# Patient Record
Sex: Female | Born: 1970 | Race: White | Hispanic: No | Marital: Married | Smoking: Never smoker
Health system: Southern US, Community
[De-identification: ages and names within clinical notes are randomized; demographics above are authoritative.]

## PROBLEM LIST (undated history)

## (undated) DIAGNOSIS — J329 Chronic sinusitis, unspecified: Secondary | ICD-10-CM

## (undated) DIAGNOSIS — E039 Hypothyroidism, unspecified: Secondary | ICD-10-CM

## (undated) DIAGNOSIS — J45909 Unspecified asthma, uncomplicated: Secondary | ICD-10-CM

## (undated) DIAGNOSIS — N63 Unspecified lump in unspecified breast: Secondary | ICD-10-CM

## (undated) DIAGNOSIS — F419 Anxiety disorder, unspecified: Secondary | ICD-10-CM

## (undated) HISTORY — DX: Unspecified asthma, uncomplicated: J45.909

## (undated) HISTORY — DX: Chronic sinusitis, unspecified: J32.9

## (undated) HISTORY — PX: ABDOMINAL HYSTERECTOMY: SHX81

## (undated) HISTORY — DX: Hypothyroidism, unspecified: E03.9

## (undated) HISTORY — PX: SINUS EXPLORATION: SHX5214

## (undated) HISTORY — DX: Anxiety disorder, unspecified: F41.9

---

## 2005-09-17 ENCOUNTER — Encounter: Admission: RE | Admit: 2005-09-17 | Discharge: 2005-09-17 | Payer: Self-pay | Admitting: Otolaryngology

## 2005-12-13 ENCOUNTER — Encounter (INDEPENDENT_AMBULATORY_CARE_PROVIDER_SITE_OTHER): Payer: Self-pay | Admitting: Specialist

## 2005-12-13 ENCOUNTER — Ambulatory Visit (HOSPITAL_BASED_OUTPATIENT_CLINIC_OR_DEPARTMENT_OTHER): Admission: RE | Admit: 2005-12-13 | Discharge: 2005-12-13 | Payer: Self-pay | Admitting: Otolaryngology

## 2006-07-24 ENCOUNTER — Other Ambulatory Visit: Admission: RE | Admit: 2006-07-24 | Discharge: 2006-07-24 | Payer: Self-pay | Admitting: Family Medicine

## 2006-09-25 ENCOUNTER — Emergency Department (HOSPITAL_COMMUNITY): Admission: EM | Admit: 2006-09-25 | Discharge: 2006-09-25 | Payer: Self-pay | Admitting: Emergency Medicine

## 2007-05-12 ENCOUNTER — Ambulatory Visit (HOSPITAL_COMMUNITY): Admission: RE | Admit: 2007-05-12 | Discharge: 2007-05-12 | Payer: Self-pay | Admitting: Family Medicine

## 2007-08-09 ENCOUNTER — Emergency Department (HOSPITAL_COMMUNITY): Admission: EM | Admit: 2007-08-09 | Discharge: 2007-08-09 | Payer: Self-pay | Admitting: Family Medicine

## 2008-06-15 ENCOUNTER — Encounter: Admission: RE | Admit: 2008-06-15 | Discharge: 2008-06-15 | Payer: Self-pay | Admitting: *Deleted

## 2008-06-24 ENCOUNTER — Other Ambulatory Visit: Admission: RE | Admit: 2008-06-24 | Discharge: 2008-06-24 | Payer: Self-pay | Admitting: Family Medicine

## 2009-04-21 ENCOUNTER — Encounter: Admission: RE | Admit: 2009-04-21 | Discharge: 2009-04-21 | Payer: Self-pay | Admitting: Allergy

## 2009-06-21 ENCOUNTER — Emergency Department (HOSPITAL_COMMUNITY): Admission: EM | Admit: 2009-06-21 | Discharge: 2009-06-21 | Payer: Self-pay | Admitting: Family Medicine

## 2009-08-15 ENCOUNTER — Other Ambulatory Visit: Admission: RE | Admit: 2009-08-15 | Discharge: 2009-08-15 | Payer: Self-pay | Admitting: Obstetrics and Gynecology

## 2010-09-17 ENCOUNTER — Other Ambulatory Visit: Payer: Self-pay | Admitting: Obstetrics and Gynecology

## 2010-09-17 ENCOUNTER — Other Ambulatory Visit (HOSPITAL_COMMUNITY)
Admission: RE | Admit: 2010-09-17 | Discharge: 2010-09-17 | Disposition: A | Discharge status: Home / Self Care | Payer: 59 | Source: Ambulatory Visit | Attending: Obstetrics and Gynecology | Admitting: Obstetrics and Gynecology

## 2010-09-17 DIAGNOSIS — Z01419 Encounter for gynecological examination (general) (routine) without abnormal findings: Secondary | ICD-10-CM | POA: Insufficient documentation

## 2010-09-25 ENCOUNTER — Other Ambulatory Visit: Payer: Self-pay | Admitting: Obstetrics and Gynecology

## 2010-09-25 DIAGNOSIS — Z1231 Encounter for screening mammogram for malignant neoplasm of breast: Secondary | ICD-10-CM

## 2010-09-27 ENCOUNTER — Ambulatory Visit
Admission: RE | Admit: 2010-09-27 | Discharge: 2010-09-27 | Disposition: A | Discharge status: Home / Self Care | Payer: 59 | Source: Ambulatory Visit | Attending: Obstetrics and Gynecology | Admitting: Obstetrics and Gynecology

## 2010-09-27 DIAGNOSIS — Z1231 Encounter for screening mammogram for malignant neoplasm of breast: Secondary | ICD-10-CM

## 2010-10-29 ENCOUNTER — Encounter (HOSPITAL_COMMUNITY): Payer: 59

## 2010-10-29 ENCOUNTER — Other Ambulatory Visit: Payer: Self-pay | Admitting: Obstetrics and Gynecology

## 2010-10-29 LAB — SURGICAL PCR SCREEN: MRSA, PCR: NEGATIVE

## 2010-10-29 LAB — BASIC METABOLIC PANEL
BUN: 10 mg/dL (ref 6–23)
GFR calc Af Amer: >60 mL/min (ref >60)
Glucose, Bld: 83 mg/dL (ref 70–99)
Potassium: 3.4 mEq/L — ABNORMAL LOW (ref 3.5–5.1)
Sodium: 131 mEq/L — ABNORMAL LOW (ref 135–145)

## 2010-10-29 LAB — CBC
MCH: 31.5 pg (ref 26.0–34.0)
MCHC: 33.2 g/dL (ref 30.0–36.0)
Platelets: 233 10*3/uL (ref 150–400)
RDW: 12.1 % (ref 11.5–15.5)

## 2010-11-02 NOTE — Op Note (Signed)
NAMEPAT, ELICKER NO.:  [id number]   MEDICAL RECORD NO.:  [id number]          PATIENT TYPE:  AMB   LOCATION:  DSC                          FACILITY:  [facility]   PHYSICIAN:  Kinnie Scales. Annalee Genta, M.D.DATE OF BIRTH:  06-Jun-1971   DATE OF PROCEDURE:  12/13/2005  DATE OF DISCHARGE:                                 OPERATIVE REPORT   PREOPERATIVE DIAGNOSES:  1.  Chronic sinusitis.  2.  Nasal polyposis.  3.  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  4.  Inferior turbinate hypertrophy.   POSTOPERATIVE DIAGNOSES:  1.  Chronic sinusitis.  2.  Nasal polyposis.  3.  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  4.  Inferior turbinate hypertrophy.   INDICATIONS FOR PROCEDURE:  1.  Chronic sinusitis.  2.  Nasal polyposis.  3.  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  4.  Inferior turbinate hypertrophy.   SURGICAL PROCEDURES:  1.  Bilateral endoscopic sinus surgery with InstaTrak guidance consisting of      bilateral total ethmoidectomy, bilateral maxillary antrostomy with      removal of diseased tissue, and nasofrontal recess exploration.  2.  Revision nasal septoplasty.  3.  Bilateral inferior turbinate reduction.   SURGEON:  Kinnie Scales. Annalee Genta, M.D.   ANESTHESIA:  General.   COMPLICATIONS:  None.   BLOOD LOSS:  Approximately 100 mL.   Tissue transferred to pathology for gross and microscopic evaluation.  The  patient will follow my office in 1 week for postoperative care.   BRIEF HISTORY:  The patient is a 40 year old white female who was referred  for evaluation of chronic sinus complaints.  She had undergone prior  septoplasty and limited endoscopic sinus surgery in 2002 and was referred to  our office for additional evaluation and treatment.  The patient complained  of chronic nasal congestion, heavy postnasal discharge, and nasal airway  obstruction.  Examination in the office revealed chronic nasal polyposis,  and CT  scanning showed diffuse mucosal disease involving the ethmoid,  maxillary and frontal sinuses.  The patient was treated with aggressive  medical therapy including topical oral steroids, antibiotics, mucus thinners  and allergy therapy and despite this treatment continued have ongoing  symptoms.  Given the chronic nature of her symptoms and history, I  recommended that we consider bilateral endoscopic sinus surgery, revision  septoplasty and turbinate reduction.  Prior to surgery a complete CT scan  with InstaTrak format was obtained and used for the surgical procedure.  The  risks, benefits and possible complications of the surgical procedures were  discussed in detail with the patient, who understood and concurred with our  plan for surgery, which is scheduled for December 13, 2005.   SURGICAL PROCEDURE:  The patient was brought to the operating room on December 13, 2005, placed in supine position on the operating table.  General  endotracheal anesthesia was established without difficulty.  The patient was  adequately anesthetized.  Her nose was injected with a total of 8 mL of 1%  lidocaine with 1:100,000 solution of epinephrine injecting in a submucosal  fashion along  the nasal septum, lateral nasal wall, middle turbinates, and  within the nasal polypoid material.  The patient's nose was then packed with  Afrin-soaked cottonoid pledgets and left in place for approximately 10  minutes to allow for vasoconstriction and hemostasis.  She was positioned on  the operating table and prepped and draped in a sterile fashion.  The  InstaTrak headgear was applied and anatomic and surgical landmarks were  identified and confirmed.   The procedure was begun on the patient's right-hand side.  Packing was  removed and using the InstaTrak, the nasal cavity was examined.  There was  significant polypoid disease within the middle meatus.  This was resected  using a 0 degree telescope and a straight  microdebrider.  The uncinate  process was reflected medially and resected with a through-cutting forceps.  Dissection was then carried out to the ethmoid bulla from anterior to  posterior along the floor of the ethmoid sinus, removing diseased mucosa and  polyps.  The posterior aspect of the ethmoid sinus was identified.  The roof  of the ethmoid was confirmed using the InstaTrak.  Using a 45 degree  telescope along the roof of the ethmoid and a curved microdebrider,  dissection was carried from posterior to anterior, again confirming location  with the InstaTrak throughout this portion of the procedure.  The  nasofrontal recess was identified.  It was completely occluded with  underlying ethmoid air cells and thick polypoid material.  This was removed  using a microdebrider under direct visualization, and the nasofrontal recess  was widely patent at the conclusion of the surgical procedure.  There was  thick mucoid material aspirated from the right frontal sinus.  Attention was  then turned to the lateral nasal wall, where residual uncinate process was  resected and the natural ostia maxillary sinus was enlarged to create a  widely patent ostium.  There was thick mucoid material aspirated from within  the right maxillary sinus.   Endoscopic sinus surgery was then undertaken on the left-hand side, where a  similar procedure was carried out.  There was dense scar tissue between the  middle turbinate and lateral nasal wall, which was divided with the through  cutting forceps.  With the middle turbinate medialized, dissection was then  carried out removing the uncinate process and resecting from anterior to  posterior along floor of the ethmoid.  The ethmoid sinus was completely  opacified with polypoid disease, which was resected.  The posterior superior  of the ethmoid was identified and dissection was then carried out using a 45 degree telescope from posterior to anterior and a curved  microdebrider with  InstaTrak guidance.  Again the nasofrontal recess was occluded.  This was  opened using a microdebrider under InstaTrak guidance and a widely patent  nasofrontal recess was created.  Attention was then turned to the lateral  nasal wall, where the natural ostium of the maxillary sinus was identified.  This was occluded with polypoid disease.  The ostium was enlarged and thick  mucoid material was aspirated from the left maxillary sinus.  This was sent  to pathology for evaluation for allergic and fungal mucin.  The sinus  cavities were then thoroughly irrigated and cleared of surgical debris.   Revision septoplasty was performed.  The patient had a large superior septal  spur with residual bone and cartilage in the superior aspect of the septum.  A vertically-oriented incision was created in the midaspect of the septum  with a #  15 scalpel and using both direct and endoscopic visualization,  mucosal flaps were elevated in order to expose the underlying bone and  cartilage, which was resected with a through-cutting forceps and the [place] Monday and a cutting Jansen-Middleton forceps.  The mucosa was reapproximated  with a 4-0 gut suture on a Keith needle and the septum was in midline  position.  Bilateral Doyle nasal septal splints were placed after the  application of Bactroban ointment and were sutured position with a 3-0  Ethilon suture.   Bilateral inferior turbinate reduction performed was with the bipolar  cautery set at 12 watts.  Two submucosal passes were made in each inferior  turbinate.  When the turbinates were adequately cauterized, they were  outfractured to create a more patent nasal cavity.   The patient's nasal cavity and nasopharynx were then irrigated and  suctioned.  A 50/50 mix of Kenalog 40 mg and Bactroban was then instilled  within the frontal, ethmoid and maxillary sinuses bilaterally, and bilateral  Kennedy sinus packs were placed in the common  ethmoid cavity on each side  and hydrated with saline.  The oral cavity was suctioned and an orogastric  tube was passed.  Stomach contents were aspirated.  The patient was then  awakened from her anesthetic.  She was extubated and transferred from the  operating room to the recovery room in stable condition.  No complications.  Blood loss approximately 100 mL.           ______________________________  Kinnie Scales. Annalee Genta, M.D.     DLS/MEDQ  D:  21/30/8657  T:  12/13/2005  Job:  [id number]

## 2010-11-06 ENCOUNTER — Other Ambulatory Visit: Payer: Self-pay | Admitting: Obstetrics and Gynecology

## 2010-11-06 ENCOUNTER — Ambulatory Visit (HOSPITAL_COMMUNITY)
Admission: RE | Admit: 2010-11-06 | Discharge: 2010-11-07 | Disposition: A | Discharge status: Home / Self Care | Payer: 59 | Source: Ambulatory Visit | Attending: Obstetrics and Gynecology | Admitting: Obstetrics and Gynecology

## 2010-11-06 DIAGNOSIS — D251 Intramural leiomyoma of uterus: Secondary | ICD-10-CM | POA: Insufficient documentation

## 2010-11-06 DIAGNOSIS — N8000 Endometriosis of the uterus, unspecified: Secondary | ICD-10-CM | POA: Insufficient documentation

## 2010-11-06 DIAGNOSIS — N812 Incomplete uterovaginal prolapse: Secondary | ICD-10-CM | POA: Insufficient documentation

## 2010-11-06 LAB — TYPE AND SCREEN
ABO/RH(D): O POS
Antibody Screen: NEGATIVE

## 2010-11-06 LAB — PREGNANCY, URINE: Preg Test, Ur: NEGATIVE

## 2010-11-06 LAB — ABO/RH: ABO/RH(D): O POS

## 2010-11-07 LAB — CBC
HCT: 37.4 % (ref 36.0–46.0)
MCHC: 32.9 g/dL (ref 30.0–36.0)
Platelets: 243 10*3/uL (ref 150–400)
RDW: 12 % (ref 11.5–15.5)

## 2010-12-10 NOTE — Op Note (Signed)
Sophia Perez, BAYLE NO.:  [id number]  MEDICAL RECORD NO.:  [id number]           PATIENT TYPE:  O  LOCATION:  9306                          FACILITY:  [facility]  PHYSICIAN:  Gerald Leitz, MD          DATE OF BIRTH:  April 05, 1971  DATE OF PROCEDURE:  11/06/2010 DATE OF DISCHARGE:                              OPERATIVE REPORT   PREOPERATIVE DIAGNOSES: 1. Uterine prolapse. 2. Cystocele. 3. Rectocele.  POSTOPERATIVE DIAGNOSIS: 1. Uterine prolapse. 2. Cystocele. 3. Rectocele.  PROCEDURE:  Vaginal hysterectomy and anterior repair.  SURGEON:  Gerald Leitz, MD  ASSISTANT:  Patsy Baltimore, MD  ANESTHESIA:  General.  FINDINGS:  Grade 3 uterine prolapse as well as moderate cystocele, very small rectocele.  SPECIMEN:  Uterus and cervix.  DISPOSITION OF SPECIMEN:  Pathology.  URINE OUTPUT:  500 mL.  ESTIMATED BLOOD LOSS:  100 mL.  FLUIDS:  1200 mL.  COMPLICATIONS:  None.  DESCRIPTION OF PROCEDURE:  The patient was taken to the operating room where she was placed under general anesthesia.  She was placed in a dorsal lithotomy position, prepped and draped in the usual sterile fashion.  A weighted speculum placed in the vaginal vault.  The anterior lip of the cervix was grasped with 2 Lahey clamps.  The cervix was injected circumferentially with 1% Xylocaine with 1:100,000 epinephrine. The cervix was then circumferentially incised with a scalpel.  The bladder was dissected off the lower uterine segment with Mayo scissors. The peritoneum was identified and entered sharply.  Attention was turned to the posterior cul-de-sac, which was incised with Mayo scissors and entered successfully. A long weighted speculum was then placed.  The uterosacral ligaments were clamped bilaterally, transected, suture ligated with 0 Vicryl.  The cardinal ligament was then clamped bilaterally, transected, and suture ligated with 0 Vicryl.  The utero- ovarian ligaments were clamped  bilaterally, transected, and the specimen was handed off.  The utero-ovarian ligaments were ligated with a free tie of 0 Vicryl followed by suture ligature of 0 Vicryl.  Ovaries appeared normal bilaterally.  The patient was noted to have some bleeding from the right sidewall.  Hemostasis was obtained with a figure- of-eight suture of 2-0 Vicryl.  Uterosacral ligaments were plicated with the 0 Vicryl.  Angle stitches were placed along the uterosacral ligaments using 0 Vicryl prior to plication.  The peritoneum was reapproximated with 2-0 chromic.  Attention was turned to the anterior vaginal mucosa which was injected with 1% Xylocaine with epinephrine. The vaginal mucosa was then incised with Metzenbaum scissors and the bladder was dissected off the vaginal mucosa.  The vesicopubic fascia was identified bilaterally and reapproximated with 2-0 Vicryl. Redundant vaginal mucosa was then excised with Mayo scissors.  The vaginal mucosa was reapproximated with 2-0 Vicryl in a running locked fashion.  The vaginal cuff was reapproximated with 0 Vicryl in a running locked fashion.  Sponge, lap, and needle counts were correct x2.  The patient was taken to recovery room awake and in stable condition.     Gerald Leitz, MD     TC/MEDQ  D:  11/06/2010  T:  11/07/2010  Job:  [id number]  Electronically Signed by Gerald Leitz MD on 12/10/2010 01:03:33 PM

## 2011-04-16 DIAGNOSIS — S0992XA Unspecified injury of nose, initial encounter: Secondary | ICD-10-CM | POA: Insufficient documentation

## 2011-04-16 DIAGNOSIS — J339 Nasal polyp, unspecified: Secondary | ICD-10-CM | POA: Insufficient documentation

## 2011-04-16 DIAGNOSIS — J329 Chronic sinusitis, unspecified: Secondary | ICD-10-CM | POA: Insufficient documentation

## 2011-10-02 ENCOUNTER — Other Ambulatory Visit (HOSPITAL_COMMUNITY)
Admission: RE | Admit: 2011-10-02 | Discharge: 2011-10-02 | Disposition: A | Discharge status: Home / Self Care | Payer: 59 | Source: Ambulatory Visit | Attending: Obstetrics and Gynecology | Admitting: Obstetrics and Gynecology

## 2011-10-02 ENCOUNTER — Other Ambulatory Visit: Payer: Self-pay | Admitting: Obstetrics and Gynecology

## 2011-10-02 DIAGNOSIS — Z01419 Encounter for gynecological examination (general) (routine) without abnormal findings: Secondary | ICD-10-CM | POA: Insufficient documentation

## 2011-12-25 DIAGNOSIS — E785 Hyperlipidemia, unspecified: Secondary | ICD-10-CM | POA: Insufficient documentation

## 2011-12-25 DIAGNOSIS — J45909 Unspecified asthma, uncomplicated: Secondary | ICD-10-CM | POA: Insufficient documentation

## 2011-12-25 DIAGNOSIS — N816 Rectocele: Secondary | ICD-10-CM | POA: Insufficient documentation

## 2012-05-08 ENCOUNTER — Other Ambulatory Visit: Payer: Self-pay

## 2012-05-08 DIAGNOSIS — R1011 Right upper quadrant pain: Secondary | ICD-10-CM

## 2012-05-12 ENCOUNTER — Ambulatory Visit
Admission: RE | Admit: 2012-05-12 | Discharge: 2012-05-12 | Disposition: A | Discharge status: Home / Self Care | Payer: 59 | Source: Ambulatory Visit

## 2012-05-12 DIAGNOSIS — R1011 Right upper quadrant pain: Secondary | ICD-10-CM

## 2013-03-11 ENCOUNTER — Other Ambulatory Visit: Payer: Self-pay | Admitting: Otolaryngology

## 2013-04-19 ENCOUNTER — Other Ambulatory Visit: Payer: Self-pay

## 2013-04-19 DIAGNOSIS — Z1231 Encounter for screening mammogram for malignant neoplasm of breast: Secondary | ICD-10-CM

## 2013-05-18 ENCOUNTER — Ambulatory Visit
Admission: RE | Admit: 2013-05-18 | Discharge: 2013-05-18 | Disposition: A | Discharge status: Home / Self Care | Payer: 59 | Source: Ambulatory Visit

## 2013-05-18 DIAGNOSIS — Z1231 Encounter for screening mammogram for malignant neoplasm of breast: Secondary | ICD-10-CM

## 2014-05-19 ENCOUNTER — Other Ambulatory Visit: Payer: Self-pay

## 2014-05-19 DIAGNOSIS — Z1231 Encounter for screening mammogram for malignant neoplasm of breast: Secondary | ICD-10-CM

## 2014-06-27 ENCOUNTER — Ambulatory Visit
Admission: RE | Admit: 2014-06-27 | Discharge: 2014-06-27 | Disposition: A | Discharge status: Home / Self Care | Payer: 59 | Source: Ambulatory Visit

## 2014-06-27 DIAGNOSIS — Z1231 Encounter for screening mammogram for malignant neoplasm of breast: Secondary | ICD-10-CM

## 2014-12-20 ENCOUNTER — Ambulatory Visit (INDEPENDENT_AMBULATORY_CARE_PROVIDER_SITE_OTHER): Payer: 59

## 2014-12-20 ENCOUNTER — Ambulatory Visit (INDEPENDENT_AMBULATORY_CARE_PROVIDER_SITE_OTHER): Payer: 59 | Admitting: Podiatry

## 2014-12-20 ENCOUNTER — Encounter: Payer: Self-pay | Admitting: Podiatry

## 2014-12-20 VITALS — BP 125/76 | HR 65 | Resp 16

## 2014-12-20 DIAGNOSIS — M2041 Other hammer toe(s) (acquired), right foot: Secondary | ICD-10-CM | POA: Diagnosis not present

## 2014-12-20 NOTE — Progress Notes (Signed)
   Subjective:    Patient ID: Sophia Perez, female    DOB: 1970/08/20, 44 y.o.   MRN: [id number]  HPI Comments: "I have these corns"  Patient c/o tender 3rd and 5th toes right since April 2016. She has callused areas. Tried corn pads and padded. Shoes are uncomfortable. Some redness.      Review of Systems  HENT: Positive for sinus pressure.   All other systems reviewed and are negative.      Objective:   Physical Exam: I have reviewed her past medical history medications allergies surgery social history and review of systems. Pulses are strongly palpable. Neurologic sensorium is intact versus once the monofilament. Deep tendon reflexes are intact bilateral and muscle strength +5 over 5 dorsiflexion plantar flexors and inverters everters all intrinsic musculature is intact. 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 Radiographs confirm these deformities with osteophytic changes of the third DIPJ. Cutaneous evaluation demonstrates supple well-hydrated cutis with the exception of mild erythema to the third DIPJ as well as velocities to the fifth digit right foot.        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  Plan: Discussed the etiology pathology conservative versus surgical therapies. At this point we signed a consent today consenting her for a DIPJ arthroplasty third digit right foot and a derotational arthroplasty fifth digit right foot. I answered all of the questions regarding these procedures to the best of my ability in layman's terms. She understood it was amenable to it and signed all 3 pages of the consent form. We did discuss the possible postop complications which may include but are not limited to stop pain bleeding swelling infection recurrence and need for further surgery also digit  also limb loss of life. She was dispensed a Darco shoe for her follow-up time.

## 2014-12-20 NOTE — Patient Instructions (Signed)
Pre-Operative Instructions  Congratulations, you have decided to take an important step to improving your quality of life.  You can be assured that the doctors of [facility] will be with you every step of the way.  1. Plan to be at the surgery center/hospital at least 1 (one) hour prior to your scheduled time unless otherwise directed by the surgical center/hospital staff.  You must have a responsible adult accompany you, remain during the surgery and drive you home.  Make sure you have directions to the surgical center/hospital and know how to get there on time. 2. For hospital based surgery you will need to obtain a history and physical form from your family physician within 1 month prior to the date of surgery- we will give you a form for you primary physician.  3. We make every effort to accommodate the date you request for surgery.  There are however, times where surgery dates or times have to be moved.  We will contact you as soon as possible if a change in schedule is required.   4. No Aspirin/Ibuprofen for one week before surgery.  If you are on aspirin, any non-steroidal anti-inflammatory medications (Mobic, Aleve, Ibuprofen) you should stop taking it 7 days prior to your surgery.  You make take Tylenol  For pain prior to surgery.  5. Medications- If you are taking daily heart and blood pressure medications, seizure, reflux, allergy, asthma, anxiety, pain or diabetes medications, make sure the surgery center/hospital is aware before the day of surgery so they may notify you which medications to take or avoid the day of surgery. 6. No food or drink after midnight the night before surgery unless directed otherwise by surgical center/hospital staff. 7. No alcoholic beverages 24 hours prior to surgery.  No smoking 24 hours prior to or 24 hours after surgery. 8. Wear loose pants or shorts- loose enough to fit over bandages, boots, and casts. 9. No slip on shoes, sneakers are best. 10. Bring  your boot with you to the surgery center/hospital.  Also bring crutches or a walker if your physician has prescribed it for you.  If you do not have this equipment, it will be provided for you after surgery. 11. If you have not been contracted by the surgery center/hospital by the day before your surgery, call to confirm the date and time of your surgery. 12. Leave-time from work may vary depending on the type of surgery you have.  Appropriate arrangements should be made prior to surgery with your employer. 13. Prescriptions will be provided immediately following surgery by your doctor.  Have these filled as soon as possible after surgery and take the medication as directed. 14. Remove nail polish on the operative foot. 15. Wash the night before surgery.  The night before surgery wash the foot and leg well with the antibacterial soap provided and water paying special attention to beneath the toenails and in between the toes.  Rinse thoroughly with water and dry well with a towel.  Perform this wash unless told not to do so by your physician.  Enclosed: 1 Ice pack (please put in freezer the night before surgery)   1 Hibiclens skin cleaner   Pre-op Instructions  If you have any questions regarding the instructions, do not hesitate to call our office.  [place]: [address] [place], [place] [address] [phone]  [place]: [address] [place], [place] [address] [phone]  [place]: [address]  [place], [place] [address] [phone]  Dr. Richard   Tuchman DPM, Dr. Norman Regal DPM Dr. Richard Sikora DPM, Dr. M. Todd Hyatt DPM, Dr. Kathryn Egerton DPM 

## 2015-06-23 DIAGNOSIS — J069 Acute upper respiratory infection, unspecified: Secondary | ICD-10-CM | POA: Diagnosis not present

## 2015-07-18 MED FILL — ALPRAZolam 0.25 MG TABS: 0.25 | 10 days supply | Qty: 10 | Fill #0

## 2015-08-25 MED FILL — SERTRALINE HCL 100 MG TAB: 100 | 30 days supply | Qty: 60 | Fill #4

## 2015-09-15 MED FILL — ALPRAZolam 0.25 MG TABS: 0.25 | 10 days supply | Qty: 10 | Fill #0

## 2015-10-09 DIAGNOSIS — J00 Acute nasopharyngitis [common cold]: Secondary | ICD-10-CM | POA: Diagnosis not present

## 2015-10-10 ENCOUNTER — Other Ambulatory Visit: Payer: Self-pay

## 2015-10-10 DIAGNOSIS — J069 Acute upper respiratory infection, unspecified: Secondary | ICD-10-CM | POA: Diagnosis not present

## 2015-10-10 DIAGNOSIS — R05 Cough: Secondary | ICD-10-CM | POA: Diagnosis not present

## 2015-10-10 DIAGNOSIS — Z1231 Encounter for screening mammogram for malignant neoplasm of breast: Secondary | ICD-10-CM

## 2015-10-15 DIAGNOSIS — R05 Cough: Secondary | ICD-10-CM | POA: Diagnosis not present

## 2015-10-24 ENCOUNTER — Ambulatory Visit
Admission: RE | Admit: 2015-10-24 | Discharge: 2015-10-24 | Disposition: A | Discharge status: Home / Self Care | Payer: 59 | Source: Ambulatory Visit

## 2015-10-24 DIAGNOSIS — Z1231 Encounter for screening mammogram for malignant neoplasm of breast: Secondary | ICD-10-CM

## 2015-10-26 ENCOUNTER — Other Ambulatory Visit: Payer: Self-pay | Admitting: Family Medicine

## 2015-10-26 DIAGNOSIS — R928 Other abnormal and inconclusive findings on diagnostic imaging of breast: Secondary | ICD-10-CM

## 2015-11-03 ENCOUNTER — Ambulatory Visit
Admission: RE | Admit: 2015-11-03 | Discharge: 2015-11-03 | Disposition: A | Discharge status: Home / Self Care | Payer: 59 | Source: Ambulatory Visit | Attending: Family Medicine | Admitting: Family Medicine

## 2015-11-03 DIAGNOSIS — R928 Other abnormal and inconclusive findings on diagnostic imaging of breast: Secondary | ICD-10-CM

## 2015-11-03 DIAGNOSIS — N63 Unspecified lump in breast: Secondary | ICD-10-CM | POA: Diagnosis not present

## 2015-11-20 DIAGNOSIS — Z0000 Encounter for general adult medical examination without abnormal findings: Secondary | ICD-10-CM | POA: Diagnosis not present

## 2015-11-20 DIAGNOSIS — Z13220 Encounter for screening for lipoid disorders: Secondary | ICD-10-CM | POA: Diagnosis not present

## 2015-11-20 DIAGNOSIS — E039 Hypothyroidism, unspecified: Secondary | ICD-10-CM | POA: Diagnosis not present

## 2015-11-20 DIAGNOSIS — Z131 Encounter for screening for diabetes mellitus: Secondary | ICD-10-CM | POA: Diagnosis not present

## 2015-11-27 MED FILL — ALPRAZolam 0.25 MG TABS: 0.25 | 10 days supply | Qty: 10 | Fill #0

## 2015-11-28 MED FILL — SERTRALINE HCL 100 MG TAB: 100 | 30 days supply | Qty: 60 | Fill #5

## 2015-12-05 MED FILL — LEVOTHYROXINE 50 MCG TABLET: 50 | 30 days supply | Qty: 30 | Fill #0

## 2016-01-09 MED FILL — LEVOTHYROXINE 50 MCG TABLET: 50 | 30 days supply | Qty: 30 | Fill #1

## 2016-01-10 MED FILL — SERTRALINE HCL 100 MG TAB: 100 | 30 days supply | Qty: 60 | Fill #0

## 2016-01-23 DIAGNOSIS — E039 Hypothyroidism, unspecified: Secondary | ICD-10-CM | POA: Diagnosis not present

## 2016-01-23 DIAGNOSIS — J302 Other seasonal allergic rhinitis: Secondary | ICD-10-CM | POA: Diagnosis not present

## 2016-01-23 DIAGNOSIS — J310 Chronic rhinitis: Secondary | ICD-10-CM | POA: Insufficient documentation

## 2016-02-15 MED FILL — ALPRAZolam 0.25 MG TABS: 0.25 | 10 days supply | Qty: 10 | Fill #0

## 2016-02-22 MED FILL — LEVOTHYROXINE 50 MCG TABLET: 50 | 30 days supply | Qty: 30 | Fill #0

## 2016-02-22 MED FILL — SERTRALINE HCL 100 MG TAB: 100 | 30 days supply | Qty: 60 | Fill #1

## 2016-04-17 MED FILL — ALPRAZolam 0.25 MG TABS: 0.25 | 10 days supply | Qty: 10 | Fill #0

## 2016-04-26 MED FILL — LEVOTHYROXINE 50 MCG TABLET: 50 | 30 days supply | Qty: 30 | Fill #1

## 2016-04-26 MED FILL — SERTRALINE HCL 100 MG TAB: 100 | 30 days supply | Qty: 60 | Fill #2

## 2016-06-26 MED FILL — SERTRALINE HCL 100 MG TAB: 100 | 30 days supply | Qty: 60 | Fill #3

## 2016-06-27 MED FILL — ALPRAZolam 0.25 MG TABS: 0.25 | 10 days supply | Qty: 10 | Fill #0

## 2016-07-16 DIAGNOSIS — F419 Anxiety disorder, unspecified: Secondary | ICD-10-CM | POA: Diagnosis not present

## 2016-07-16 DIAGNOSIS — I7300 Raynaud's syndrome without gangrene: Secondary | ICD-10-CM | POA: Diagnosis not present

## 2016-07-16 DIAGNOSIS — E039 Hypothyroidism, unspecified: Secondary | ICD-10-CM | POA: Diagnosis not present

## 2016-07-25 DIAGNOSIS — N63 Unspecified lump in unspecified breast: Secondary | ICD-10-CM | POA: Diagnosis not present

## 2016-07-26 ENCOUNTER — Other Ambulatory Visit: Payer: Self-pay | Admitting: Family Medicine

## 2016-07-26 DIAGNOSIS — N6310 Unspecified lump in the right breast, unspecified quadrant: Secondary | ICD-10-CM

## 2016-07-30 ENCOUNTER — Ambulatory Visit
Admission: RE | Admit: 2016-07-30 | Discharge: 2016-07-30 | Disposition: A | Discharge status: Home / Self Care | Payer: 59 | Source: Ambulatory Visit | Attending: Family Medicine | Admitting: Family Medicine

## 2016-07-30 DIAGNOSIS — N63 Unspecified lump in unspecified breast: Secondary | ICD-10-CM

## 2016-07-30 DIAGNOSIS — N6310 Unspecified lump in the right breast, unspecified quadrant: Secondary | ICD-10-CM

## 2016-07-30 DIAGNOSIS — R928 Other abnormal and inconclusive findings on diagnostic imaging of breast: Secondary | ICD-10-CM | POA: Diagnosis not present

## 2016-07-30 DIAGNOSIS — N6001 Solitary cyst of right breast: Secondary | ICD-10-CM | POA: Diagnosis not present

## 2016-07-30 HISTORY — DX: Unspecified lump in unspecified breast: N63.0

## 2016-08-30 MED FILL — SERTRALINE HCL 100 MG TAB: 100 | 30 days supply | Qty: 60 | Fill #4

## 2016-09-02 MED FILL — ALPRAZolam 0.25 MG TABS: 0.25 | 10 days supply | Qty: 10 | Fill #0

## 2016-10-04 DIAGNOSIS — J320 Chronic maxillary sinusitis: Secondary | ICD-10-CM | POA: Diagnosis not present

## 2016-10-04 DIAGNOSIS — Z87891 Personal history of nicotine dependence: Secondary | ICD-10-CM | POA: Diagnosis not present

## 2016-10-04 DIAGNOSIS — Z8709 Personal history of other diseases of the respiratory system: Secondary | ICD-10-CM | POA: Diagnosis not present

## 2016-10-04 MED FILL — DOXYCYCLINE HYCLATE 100 MG: 100 | 14 days supply | Qty: 28 | Fill #0

## 2016-10-05 DIAGNOSIS — B9689 Other specified bacterial agents as the cause of diseases classified elsewhere: Secondary | ICD-10-CM | POA: Diagnosis not present

## 2016-10-05 DIAGNOSIS — J320 Chronic maxillary sinusitis: Secondary | ICD-10-CM | POA: Diagnosis not present

## 2016-10-10 MED FILL — SERTRALINE HCL 100 MG TAB: 100 | 30 days supply | Qty: 60 | Fill #0

## 2016-10-22 MED FILL — ALPRAZolam 0.25 MG TABS: 0.25 | 10 days supply | Qty: 10 | Fill #0

## 2016-11-26 MED FILL — SERTRALINE HCL 100 MG TAB: 100 | 30 days supply | Qty: 60 | Fill #1

## 2016-11-26 MED FILL — ALPRAZolam 0.25 MG TABS: 0.25 | 10 days supply | Qty: 10 | Fill #1

## 2016-12-26 DIAGNOSIS — Z0000 Encounter for general adult medical examination without abnormal findings: Secondary | ICD-10-CM | POA: Diagnosis not present

## 2016-12-26 DIAGNOSIS — E039 Hypothyroidism, unspecified: Secondary | ICD-10-CM | POA: Diagnosis not present

## 2016-12-26 DIAGNOSIS — M549 Dorsalgia, unspecified: Secondary | ICD-10-CM | POA: Diagnosis not present

## 2016-12-26 DIAGNOSIS — E785 Hyperlipidemia, unspecified: Secondary | ICD-10-CM | POA: Diagnosis not present

## 2016-12-26 DIAGNOSIS — F419 Anxiety disorder, unspecified: Secondary | ICD-10-CM | POA: Diagnosis not present

## 2016-12-26 DIAGNOSIS — Z1211 Encounter for screening for malignant neoplasm of colon: Secondary | ICD-10-CM | POA: Diagnosis not present

## 2016-12-26 MED FILL — SERTRALINE HCL 100 MG TAB: 100 | 30 days supply | Qty: 30 | Fill #0

## 2016-12-26 MED FILL — ALPRAZolam 0.25 MG TABS: 0.25 | 10 days supply | Qty: 10 | Fill #0

## 2017-01-27 MED FILL — SERTRALINE HCL 100 MG TAB: 100 | 30 days supply | Qty: 30 | Fill #1

## 2017-01-27 MED FILL — ALPRAZolam 0.25 MG TABS: 0.25 | 10 days supply | Qty: 10 | Fill #1

## 2017-02-18 DIAGNOSIS — Z1211 Encounter for screening for malignant neoplasm of colon: Secondary | ICD-10-CM | POA: Diagnosis not present

## 2017-03-10 MED FILL — SERTRALINE HCL 100 MG TAB: 100 | 30 days supply | Qty: 30 | Fill #2

## 2017-03-10 MED FILL — PEG-3350 SOLUTION: 420 | 1 days supply | Qty: 4000 | Fill #0

## 2017-03-19 DIAGNOSIS — K640 First degree hemorrhoids: Secondary | ICD-10-CM | POA: Diagnosis not present

## 2017-03-19 DIAGNOSIS — Z1211 Encounter for screening for malignant neoplasm of colon: Secondary | ICD-10-CM | POA: Diagnosis not present

## 2017-05-07 MED FILL — SERTRALINE HCL 100 MG TAB: 100 | 30 days supply | Qty: 30 | Fill #3

## 2017-05-07 MED FILL — ALPRAZolam 0.25 MG TABS: 0.25 | 10 days supply | Qty: 10 | Fill #0

## 2017-07-01 MED FILL — ALPRAZolam 0.25 MG TABS: 0.25 | 10 days supply | Qty: 10 | Fill #1

## 2017-07-01 MED FILL — SERTRALINE HCL 100 MG TAB: 100 | 30 days supply | Qty: 30 | Fill #4

## 2017-07-11 DIAGNOSIS — J329 Chronic sinusitis, unspecified: Secondary | ICD-10-CM | POA: Diagnosis not present

## 2017-07-11 DIAGNOSIS — M25512 Pain in left shoulder: Secondary | ICD-10-CM | POA: Diagnosis not present

## 2017-07-11 DIAGNOSIS — F419 Anxiety disorder, unspecified: Secondary | ICD-10-CM | POA: Diagnosis not present

## 2017-08-14 MED FILL — SERTRALINE HCL 100 MG TAB: 100 | 30 days supply | Qty: 30 | Fill #5

## 2017-08-17 DIAGNOSIS — J0100 Acute maxillary sinusitis, unspecified: Secondary | ICD-10-CM | POA: Diagnosis not present

## 2017-08-18 DIAGNOSIS — J040 Acute laryngitis: Secondary | ICD-10-CM | POA: Diagnosis not present

## 2017-10-23 DIAGNOSIS — H5201 Hypermetropia, right eye: Secondary | ICD-10-CM | POA: Diagnosis not present

## 2017-10-23 DIAGNOSIS — H524 Presbyopia: Secondary | ICD-10-CM | POA: Diagnosis not present

## 2017-10-27 MED FILL — SERTRALINE HCL 100 MG TAB: 100 | 30 days supply | Qty: 30 | Fill #0

## 2017-11-24 MED FILL — ALPRAZolam 0.25 MG TABS: 0.25 | 10 days supply | Qty: 10 | Fill #0

## 2017-11-24 MED FILL — SERTRALINE HCL 100 MG TAB: 100 | 30 days supply | Qty: 30 | Fill #1

## 2018-01-07 MED FILL — SERTRALINE HCL 100 MG TAB: 100 | 30 days supply | Qty: 30 | Fill #0

## 2018-01-12 DIAGNOSIS — E785 Hyperlipidemia, unspecified: Secondary | ICD-10-CM | POA: Diagnosis not present

## 2018-01-12 DIAGNOSIS — Z0000 Encounter for general adult medical examination without abnormal findings: Secondary | ICD-10-CM | POA: Diagnosis not present

## 2018-01-12 DIAGNOSIS — E039 Hypothyroidism, unspecified: Secondary | ICD-10-CM | POA: Diagnosis not present

## 2018-01-12 DIAGNOSIS — F419 Anxiety disorder, unspecified: Secondary | ICD-10-CM | POA: Diagnosis not present

## 2018-01-20 MED FILL — ALPRAZolam 0.25 MG TABS: 0.25 | 10 days supply | Qty: 10 | Fill #1

## 2018-01-21 MED FILL — LEVOTHYROXINE 50 MCG TABLET: 50 | 30 days supply | Qty: 30 | Fill #0

## 2018-01-30 DIAGNOSIS — M545 Low back pain: Secondary | ICD-10-CM | POA: Diagnosis not present

## 2018-02-02 MED FILL — METHOCARBAMOL 500 MG TABS: 500 | 30 days supply | Qty: 30 | Fill #0

## 2018-02-19 MED FILL — LEVOTHYROXINE 50 MCG TABLET: 50 | 30 days supply | Qty: 30 | Fill #1

## 2018-02-20 MED FILL — SERTRALINE HCL 100 MG TAB: 100 | 30 days supply | Qty: 30 | Fill #0

## 2018-03-04 DIAGNOSIS — E039 Hypothyroidism, unspecified: Secondary | ICD-10-CM | POA: Diagnosis not present

## 2018-03-05 DIAGNOSIS — M41125 Adolescent idiopathic scoliosis, thoracolumbar region: Secondary | ICD-10-CM | POA: Diagnosis not present

## 2018-03-05 DIAGNOSIS — M549 Dorsalgia, unspecified: Secondary | ICD-10-CM | POA: Diagnosis not present

## 2018-03-05 DIAGNOSIS — M546 Pain in thoracic spine: Secondary | ICD-10-CM | POA: Diagnosis not present

## 2018-03-05 DIAGNOSIS — M5416 Radiculopathy, lumbar region: Secondary | ICD-10-CM | POA: Diagnosis not present

## 2018-03-05 MED FILL — DICLOFENAC SODIUM 75 MG TAB: 75 | 30 days supply | Qty: 60 | Fill #0

## 2018-03-20 MED FILL — SERTRALINE HCL 100 MG TAB: 100 | 30 days supply | Qty: 30 | Fill #1

## 2018-03-23 MED FILL — ALPRAZolam 0.25 MG TABS: 0.25 | 10 days supply | Qty: 10 | Fill #0

## 2018-03-23 MED FILL — LEVOTHYROXINE 50 MCG TABLET: 50 | 30 days supply | Qty: 30 | Fill #0

## 2018-03-27 DIAGNOSIS — M5416 Radiculopathy, lumbar region: Secondary | ICD-10-CM | POA: Diagnosis not present

## 2018-03-27 DIAGNOSIS — M41125 Adolescent idiopathic scoliosis, thoracolumbar region: Secondary | ICD-10-CM | POA: Diagnosis not present

## 2018-05-06 MED FILL — SERTRALINE HCL 100 MG TAB: 100 | 30 days supply | Qty: 30 | Fill #2

## 2018-05-06 MED FILL — ALPRAZolam 0.25 MG TABS: 0.25 | 10 days supply | Qty: 10 | Fill #1

## 2018-05-06 MED FILL — LEVOTHYROXINE 50 MCG TABLET: 50 | 30 days supply | Qty: 30 | Fill #1

## 2018-05-20 ENCOUNTER — Other Ambulatory Visit: Payer: Self-pay | Admitting: Family Medicine

## 2018-05-20 DIAGNOSIS — Z1231 Encounter for screening mammogram for malignant neoplasm of breast: Secondary | ICD-10-CM

## 2018-06-24 MED FILL — SERTRALINE HCL 100 MG TAB: 100 | 30 days supply | Qty: 30 | Fill #3

## 2018-06-24 MED FILL — ALPRAZolam 0.25 MG TABS: 0.25 | 10 days supply | Qty: 10 | Fill #2

## 2018-06-24 MED FILL — LEVOTHYROXINE 50 MCG TABLET: 50 | 30 days supply | Qty: 30 | Fill #2

## 2018-07-02 ENCOUNTER — Ambulatory Visit
Admission: RE | Admit: 2018-07-02 | Discharge: 2018-07-02 | Disposition: A | Discharge status: Home / Self Care | Payer: 59 | Source: Ambulatory Visit | Attending: Family Medicine | Admitting: Family Medicine

## 2018-07-02 DIAGNOSIS — Z1231 Encounter for screening mammogram for malignant neoplasm of breast: Secondary | ICD-10-CM

## 2018-07-13 DIAGNOSIS — E039 Hypothyroidism, unspecified: Secondary | ICD-10-CM | POA: Diagnosis not present

## 2018-07-13 DIAGNOSIS — F419 Anxiety disorder, unspecified: Secondary | ICD-10-CM | POA: Diagnosis not present

## 2018-08-14 MED FILL — SERTRALINE HCL 100 MG TAB: 100 | 30 days supply | Qty: 30 | Fill #4

## 2018-08-14 MED FILL — LEVOTHYROXINE 50 MCG TABLET: 50 | 30 days supply | Qty: 30 | Fill #3

## 2018-09-07 MED FILL — SERTRALINE HCL 100 MG TAB: 100 | 30 days supply | Qty: 30 | Fill #0 | Status: TO

## 2018-09-07 MED FILL — LEVOTHYROXINE 50 MCG TABLET: 50 | 30 days supply | Qty: 30 | Fill #4 | Status: TO

## 2018-10-10 MED FILL — SERTRALINE HCL 100 MG TAB: 100 | 30 days supply | Qty: 30 | Fill #0

## 2018-10-10 MED FILL — LEVOTHYROXINE 50 MCG TABLET: 50 | 30 days supply | Qty: 30 | Fill #0

## 2018-11-26 MED FILL — LEVOTHYROXINE 50 MCG TABLET: 50 | 30 days supply | Qty: 30 | Fill #0

## 2018-11-26 MED FILL — SERTRALINE HCL 100 MG TAB: 100 | 30 days supply | Qty: 30 | Fill #0

## 2018-12-17 ENCOUNTER — Other Ambulatory Visit: Payer: Self-pay | Admitting: Family Medicine

## 2018-12-17 DIAGNOSIS — N6310 Unspecified lump in the right breast, unspecified quadrant: Secondary | ICD-10-CM

## 2018-12-24 ENCOUNTER — Ambulatory Visit
Admission: RE | Admit: 2018-12-24 | Discharge: 2018-12-24 | Disposition: A | Discharge status: Home / Self Care | Payer: 59 | Source: Ambulatory Visit | Attending: Family Medicine | Admitting: Family Medicine

## 2018-12-24 ENCOUNTER — Other Ambulatory Visit: Payer: Self-pay

## 2018-12-24 DIAGNOSIS — N6310 Unspecified lump in the right breast, unspecified quadrant: Secondary | ICD-10-CM

## 2018-12-24 DIAGNOSIS — R922 Inconclusive mammogram: Secondary | ICD-10-CM | POA: Diagnosis not present

## 2018-12-24 DIAGNOSIS — N6001 Solitary cyst of right breast: Secondary | ICD-10-CM | POA: Diagnosis not present

## 2019-01-12 MED FILL — LEVOTHYROXINE 50 MCG TABLET: 50 | 30 days supply | Qty: 30 | Fill #1

## 2019-01-12 MED FILL — SERTRALINE HCL 100 MG TAB: 100 | 30 days supply | Qty: 30 | Fill #1

## 2019-01-12 MED FILL — DICLOFENAC SODIUM 75 MG TAB: 75 | 30 days supply | Qty: 60 | Fill #1

## 2019-02-01 DIAGNOSIS — Z0000 Encounter for general adult medical examination without abnormal findings: Secondary | ICD-10-CM | POA: Diagnosis not present

## 2019-02-01 DIAGNOSIS — E785 Hyperlipidemia, unspecified: Secondary | ICD-10-CM | POA: Diagnosis not present

## 2019-02-01 DIAGNOSIS — F419 Anxiety disorder, unspecified: Secondary | ICD-10-CM | POA: Diagnosis not present

## 2019-02-01 DIAGNOSIS — E039 Hypothyroidism, unspecified: Secondary | ICD-10-CM | POA: Diagnosis not present

## 2019-03-15 MED FILL — ALPRAZolam 0.25 MG TABS: 0.25 | 10 days supply | Qty: 10 | Fill #0

## 2019-03-23 MED FILL — SERTRALINE HCL 100 MG TAB: 100 | 30 days supply | Qty: 30 | Fill #2

## 2019-03-30 DIAGNOSIS — M5416 Radiculopathy, lumbar region: Secondary | ICD-10-CM | POA: Diagnosis not present

## 2019-03-30 DIAGNOSIS — M41125 Adolescent idiopathic scoliosis, thoracolumbar region: Secondary | ICD-10-CM | POA: Diagnosis not present

## 2019-04-28 DIAGNOSIS — L989 Disorder of the skin and subcutaneous tissue, unspecified: Secondary | ICD-10-CM | POA: Diagnosis not present

## 2019-05-04 MED FILL — SERTRALINE HCL 100 MG TAB: 100 | 30 days supply | Qty: 30 | Fill #3

## 2019-05-04 MED FILL — ALPRAZolam 0.25 MG TABS: 0.25 | 10 days supply | Qty: 10 | Fill #1

## 2019-05-04 MED FILL — LEVOTHYROXINE 50 MCG TABLET: 50 | 30 days supply | Qty: 30 | Fill #1

## 2019-05-14 DIAGNOSIS — Z20828 Contact with and (suspected) exposure to other viral communicable diseases: Secondary | ICD-10-CM | POA: Diagnosis not present

## 2019-05-14 DIAGNOSIS — R519 Headache, unspecified: Secondary | ICD-10-CM | POA: Diagnosis not present

## 2019-06-15 MED FILL — SERTRALINE HCL 100 MG TAB: 100 | 30 days supply | Qty: 30 | Fill #4

## 2019-06-15 MED FILL — LEVOTHYROXINE 50 MCG TABLET: 50 | 30 days supply | Qty: 30 | Fill #2

## 2019-07-30 MED FILL — SERTRALINE HCL 100 MG TAB: 100 | 90 days supply | Qty: 90 | Fill #0

## 2019-07-30 MED FILL — ALPRAZolam 0.25 MG TABS: 0.25 | 10 days supply | Qty: 10 | Fill #2

## 2019-08-03 DIAGNOSIS — D2271 Melanocytic nevi of right lower limb, including hip: Secondary | ICD-10-CM | POA: Diagnosis not present

## 2019-08-03 DIAGNOSIS — L821 Other seborrheic keratosis: Secondary | ICD-10-CM | POA: Diagnosis not present

## 2019-08-03 DIAGNOSIS — D2272 Melanocytic nevi of left lower limb, including hip: Secondary | ICD-10-CM | POA: Diagnosis not present

## 2019-08-03 DIAGNOSIS — I788 Other diseases of capillaries: Secondary | ICD-10-CM | POA: Diagnosis not present

## 2019-08-03 DIAGNOSIS — D225 Melanocytic nevi of trunk: Secondary | ICD-10-CM | POA: Diagnosis not present

## 2019-08-03 DIAGNOSIS — L812 Freckles: Secondary | ICD-10-CM | POA: Diagnosis not present

## 2019-08-03 DIAGNOSIS — D2261 Melanocytic nevi of right upper limb, including shoulder: Secondary | ICD-10-CM | POA: Diagnosis not present

## 2019-08-03 DIAGNOSIS — D2262 Melanocytic nevi of left upper limb, including shoulder: Secondary | ICD-10-CM | POA: Diagnosis not present

## 2019-08-30 MED FILL — LEVOTHYROXINE 50 MCG TABLET: 50 | 30 days supply | Qty: 30 | Fill #3

## 2019-09-01 DIAGNOSIS — F419 Anxiety disorder, unspecified: Secondary | ICD-10-CM | POA: Diagnosis not present

## 2019-09-01 DIAGNOSIS — E039 Hypothyroidism, unspecified: Secondary | ICD-10-CM | POA: Diagnosis not present

## 2019-10-13 MED FILL — LEVOTHYROXINE 50 MCG TABLET: 50 | 30 days supply | Qty: 30 | Fill #4

## 2019-10-18 MED FILL — ALPRAZolam 0.25 MG TABS: 0.25 | 10 days supply | Qty: 10 | Fill #0

## 2019-10-28 DIAGNOSIS — F331 Major depressive disorder, recurrent, moderate: Secondary | ICD-10-CM | POA: Diagnosis not present

## 2019-11-09 MED FILL — SERTRALINE HCL 100 MG TAB: 100 | 90 days supply | Qty: 90 | Fill #1

## 2019-11-11 DIAGNOSIS — F331 Major depressive disorder, recurrent, moderate: Secondary | ICD-10-CM | POA: Diagnosis not present

## 2019-12-03 MED FILL — LEVOTHYROXINE 50 MCG TABLET: 50 | 30 days supply | Qty: 30 | Fill #5

## 2019-12-21 DIAGNOSIS — F331 Major depressive disorder, recurrent, moderate: Secondary | ICD-10-CM | POA: Diagnosis not present

## 2020-01-17 DIAGNOSIS — F331 Major depressive disorder, recurrent, moderate: Secondary | ICD-10-CM | POA: Diagnosis not present

## 2020-01-18 DIAGNOSIS — H539 Unspecified visual disturbance: Secondary | ICD-10-CM | POA: Diagnosis not present

## 2020-01-31 DIAGNOSIS — L814 Other melanin hyperpigmentation: Secondary | ICD-10-CM | POA: Diagnosis not present

## 2020-01-31 DIAGNOSIS — D225 Melanocytic nevi of trunk: Secondary | ICD-10-CM | POA: Diagnosis not present

## 2020-01-31 DIAGNOSIS — L821 Other seborrheic keratosis: Secondary | ICD-10-CM | POA: Diagnosis not present

## 2020-01-31 DIAGNOSIS — L700 Acne vulgaris: Secondary | ICD-10-CM | POA: Diagnosis not present

## 2020-01-31 DIAGNOSIS — D2271 Melanocytic nevi of right lower limb, including hip: Secondary | ICD-10-CM | POA: Diagnosis not present

## 2020-02-03 MED FILL — LEVOTHYROXINE 50 MCG TABLET: 50 | 30 days supply | Qty: 30 | Fill #0

## 2020-02-15 ENCOUNTER — Other Ambulatory Visit (HOSPITAL_COMMUNITY): Payer: Self-pay | Admitting: Family Medicine

## 2020-02-15 MED FILL — SERTRALINE HCL 100 MG TAB: 100 | 90 days supply | Qty: 90 | Fill #0

## 2020-02-15 MED FILL — ALPRAZolam 0.25 MG TABS: 0.25 | 10 days supply | Qty: 10 | Fill #1

## 2020-02-17 DIAGNOSIS — F331 Major depressive disorder, recurrent, moderate: Secondary | ICD-10-CM | POA: Diagnosis not present

## 2020-02-29 DIAGNOSIS — H524 Presbyopia: Secondary | ICD-10-CM | POA: Diagnosis not present

## 2020-02-29 DIAGNOSIS — H5310 Unspecified subjective visual disturbances: Secondary | ICD-10-CM | POA: Diagnosis not present

## 2020-02-29 DIAGNOSIS — H5203 Hypermetropia, bilateral: Secondary | ICD-10-CM | POA: Diagnosis not present

## 2020-02-29 DIAGNOSIS — H0288B Meibomian gland dysfunction left eye, upper and lower eyelids: Secondary | ICD-10-CM | POA: Diagnosis not present

## 2020-02-29 DIAGNOSIS — G43109 Migraine with aura, not intractable, without status migrainosus: Secondary | ICD-10-CM | POA: Diagnosis not present

## 2020-02-29 DIAGNOSIS — H0288A Meibomian gland dysfunction right eye, upper and lower eyelids: Secondary | ICD-10-CM | POA: Diagnosis not present

## 2020-03-02 DIAGNOSIS — Z0000 Encounter for general adult medical examination without abnormal findings: Secondary | ICD-10-CM | POA: Diagnosis not present

## 2020-03-02 DIAGNOSIS — E039 Hypothyroidism, unspecified: Secondary | ICD-10-CM | POA: Diagnosis not present

## 2020-03-02 DIAGNOSIS — E785 Hyperlipidemia, unspecified: Secondary | ICD-10-CM | POA: Diagnosis not present

## 2020-03-02 DIAGNOSIS — F419 Anxiety disorder, unspecified: Secondary | ICD-10-CM | POA: Diagnosis not present

## 2020-03-02 DIAGNOSIS — Z23 Encounter for immunization: Secondary | ICD-10-CM | POA: Diagnosis not present

## 2020-03-02 MED FILL — LEVOTHYROXINE 50 MCG TABLET: 50 | 90 days supply | Qty: 90 | Fill #0

## 2020-03-13 DIAGNOSIS — F331 Major depressive disorder, recurrent, moderate: Secondary | ICD-10-CM | POA: Diagnosis not present

## 2020-03-22 ENCOUNTER — Other Ambulatory Visit (HOSPITAL_COMMUNITY): Payer: Self-pay | Admitting: Family Medicine

## 2020-03-22 DIAGNOSIS — E039 Hypothyroidism, unspecified: Secondary | ICD-10-CM | POA: Diagnosis not present

## 2020-03-22 DIAGNOSIS — R059 Cough, unspecified: Secondary | ICD-10-CM | POA: Diagnosis not present

## 2020-03-22 DIAGNOSIS — R062 Wheezing: Secondary | ICD-10-CM | POA: Diagnosis not present

## 2020-03-22 DIAGNOSIS — J988 Other specified respiratory disorders: Secondary | ICD-10-CM | POA: Diagnosis not present

## 2020-03-22 MED FILL — AZITHROMYCIN 250 MG TABLET: 250 | 5 days supply | Qty: 6 | Fill #0

## 2020-03-22 MED FILL — FLUCONAZOLE 150 MG TABS: 150 | 1 days supply | Qty: 1 | Fill #0

## 2020-03-22 MED FILL — LEVOTHYROXINE 50 MCG TABLET: 50 | 90 days supply | Qty: 90 | Fill #0

## 2020-03-30 ENCOUNTER — Other Ambulatory Visit (HOSPITAL_COMMUNITY): Payer: Self-pay | Admitting: Student

## 2020-03-30 DIAGNOSIS — M5416 Radiculopathy, lumbar region: Secondary | ICD-10-CM | POA: Diagnosis not present

## 2020-03-30 MED FILL — DICLOFENAC SOD EC 75 MG TAB: 75 | 30 days supply | Qty: 60 | Fill #0

## 2020-04-14 MED FILL — ALPRAZolam 0.25 MG TABS: 0.25 | 10 days supply | Qty: 10 | Fill #2

## 2020-04-20 DIAGNOSIS — M5416 Radiculopathy, lumbar region: Secondary | ICD-10-CM | POA: Diagnosis not present

## 2020-04-20 DIAGNOSIS — M47816 Spondylosis without myelopathy or radiculopathy, lumbar region: Secondary | ICD-10-CM | POA: Diagnosis not present

## 2020-05-01 DIAGNOSIS — F331 Major depressive disorder, recurrent, moderate: Secondary | ICD-10-CM | POA: Diagnosis not present

## 2020-05-18 ENCOUNTER — Other Ambulatory Visit (HOSPITAL_COMMUNITY): Payer: Self-pay | Admitting: Family Medicine

## 2020-05-18 DIAGNOSIS — J45909 Unspecified asthma, uncomplicated: Secondary | ICD-10-CM | POA: Diagnosis not present

## 2020-05-18 MED FILL — ALBUTEROL SULFATE HFA 108 (: 108 (90 BAS | 17 days supply | Qty: 9 | Fill #0

## 2020-05-19 ENCOUNTER — Other Ambulatory Visit (HOSPITAL_COMMUNITY): Payer: Self-pay | Admitting: Family Medicine

## 2020-05-19 MED FILL — ADVAIR 250/50 DISKUS: 250-50 | 30 days supply | Qty: 60 | Fill #0

## 2020-06-07 MED FILL — SERTRALINE HCL 100 MG TAB: 100 | 90 days supply | Qty: 90 | Fill #1

## 2020-06-16 MED FILL — ADVAIR 250/50 DISKUS: 250-50 | 30 days supply | Qty: 60 | Fill #1

## 2020-07-26 MED FILL — LEVOTHYROXINE 50 MCG TABLET: 50 | 90 days supply | Qty: 90 | Fill #1

## 2020-07-26 MED FILL — ADVAIR 250/50 DISKUS: 250-50 | 30 days supply | Qty: 60 | Fill #2

## 2020-08-17 ENCOUNTER — Other Ambulatory Visit (HOSPITAL_COMMUNITY): Payer: Self-pay | Admitting: Pharmacist

## 2020-08-17 MED FILL — CARESTART COVID-19 HOME TES: 4 days supply | Qty: 4 | Fill #0

## 2020-08-29 ENCOUNTER — Other Ambulatory Visit: Payer: Self-pay | Admitting: Family Medicine

## 2020-08-29 ENCOUNTER — Other Ambulatory Visit (HOSPITAL_COMMUNITY): Payer: Self-pay | Admitting: Family Medicine

## 2020-08-29 DIAGNOSIS — Z1231 Encounter for screening mammogram for malignant neoplasm of breast: Secondary | ICD-10-CM

## 2020-08-30 ENCOUNTER — Other Ambulatory Visit (HOSPITAL_COMMUNITY): Payer: Self-pay | Admitting: Family Medicine

## 2020-08-30 DIAGNOSIS — J45909 Unspecified asthma, uncomplicated: Secondary | ICD-10-CM | POA: Diagnosis not present

## 2020-08-30 DIAGNOSIS — E039 Hypothyroidism, unspecified: Secondary | ICD-10-CM | POA: Diagnosis not present

## 2020-08-30 DIAGNOSIS — F419 Anxiety disorder, unspecified: Secondary | ICD-10-CM | POA: Diagnosis not present

## 2020-08-31 ENCOUNTER — Inpatient Hospital Stay: Admission: RE | Admit: 2020-08-31 | Payer: 59 | Source: Ambulatory Visit

## 2020-10-25 ENCOUNTER — Other Ambulatory Visit (HOSPITAL_COMMUNITY): Payer: Self-pay

## 2020-10-26 ENCOUNTER — Other Ambulatory Visit (HOSPITAL_COMMUNITY): Payer: Self-pay

## 2020-10-26 MED ORDER — FLUTICASONE-SALMETEROL 250-50 MCG/ACT IN AEPB
1.0000 | INHALATION_SPRAY | Freq: Two times a day (BID) | RESPIRATORY_TRACT | 5 refills | Status: DC
  Filled 2020-10-26: qty 60, 30d supply, fill #0
  Filled 2020-12-08: qty 60, 30d supply, fill #1
  Filled 2021-01-17: qty 180, 90d supply, fill #2
  Filled 2021-06-22: qty 60, 30d supply, fill #3

## 2020-10-27 ENCOUNTER — Other Ambulatory Visit (HOSPITAL_COMMUNITY): Payer: Self-pay

## 2020-12-01 IMAGING — US ULTRASOUND RIGHT BREAST LIMITED
1 series · 5 of 5 positions shown · non-contrast
Comparison: Previous exam(s).

CLINICAL DATA: 48-year-old female with a palpable abnormality in
the upper-outer right breast. The patient has history of cysts in
this location.

EXAM:
DIGITAL DIAGNOSTIC UNILATERAL RIGHT MAMMOGRAM WITH CAD AND TOMO
RIGHT BREAST ULTRASOUND

[Series 1: ultrasound right breast limited · 0.07mm/px · 5 of 5 slices shown]
[im 1/5]
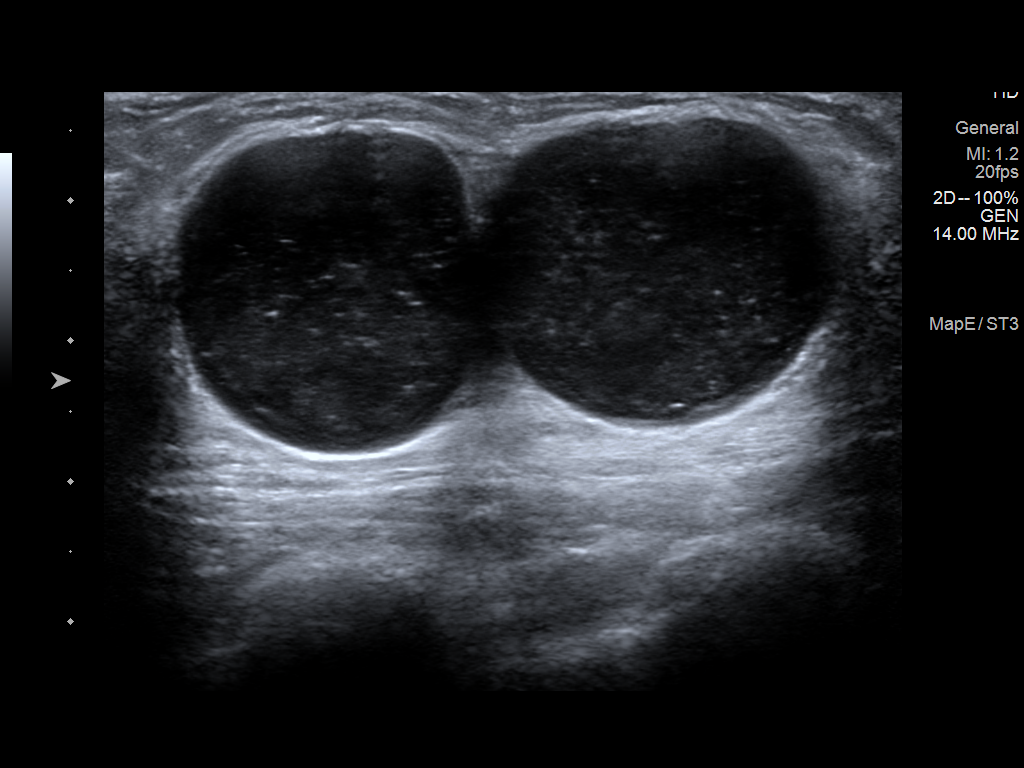
[im 2/5]
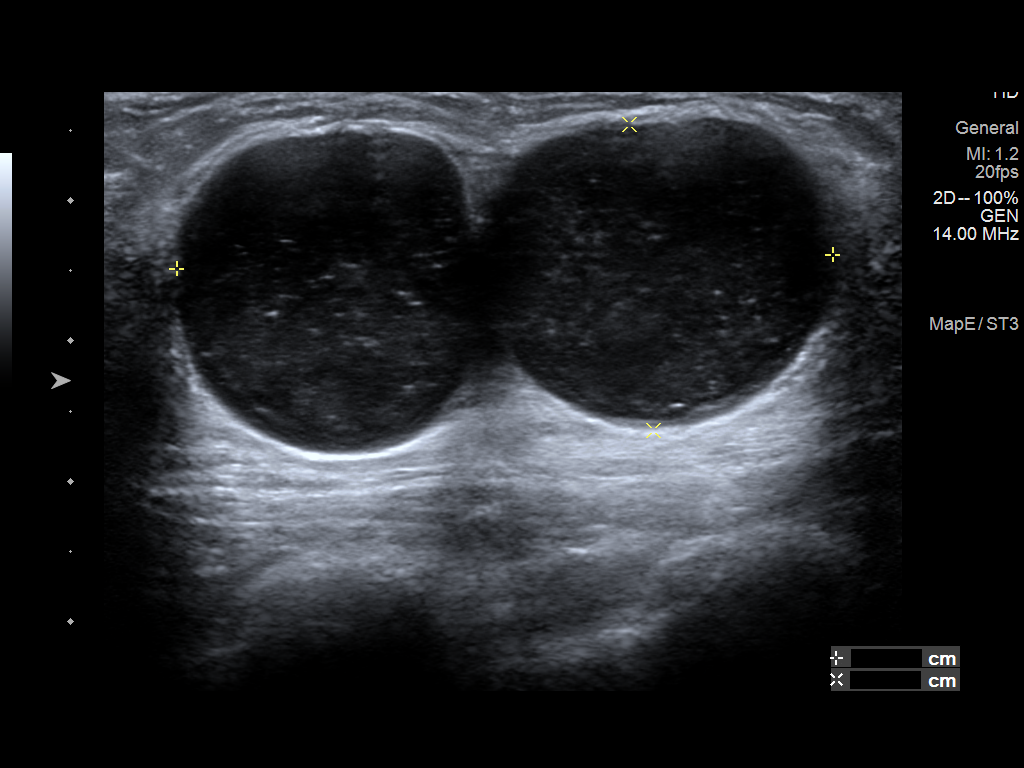
[im 3/5]
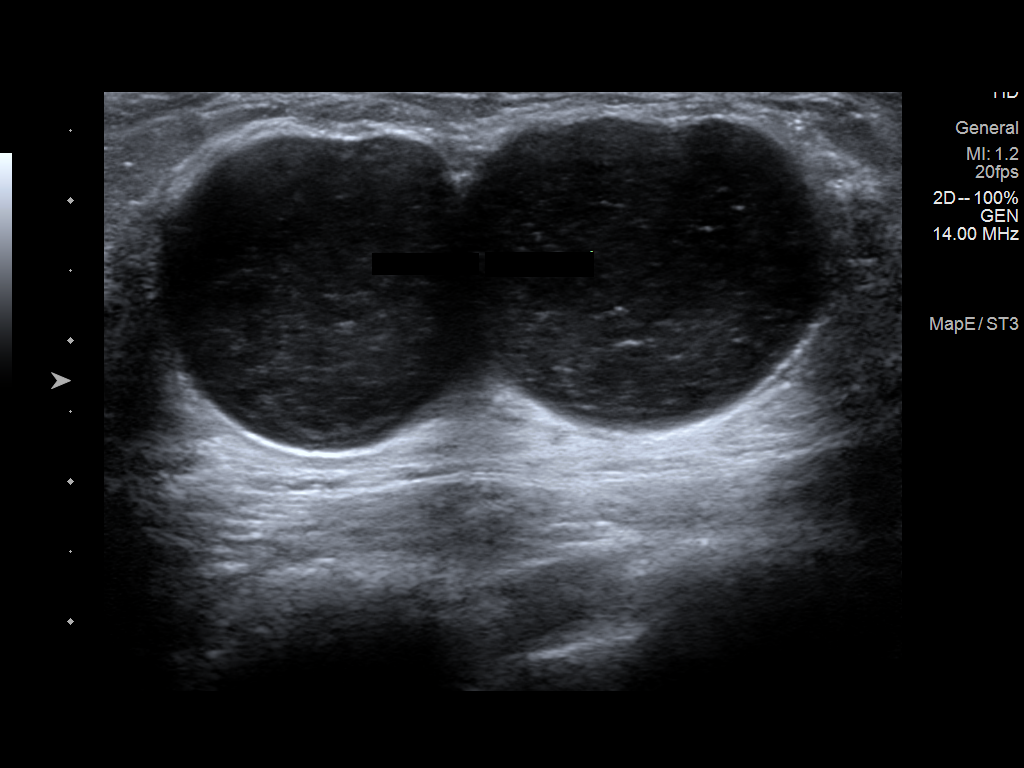
[im 4/5]
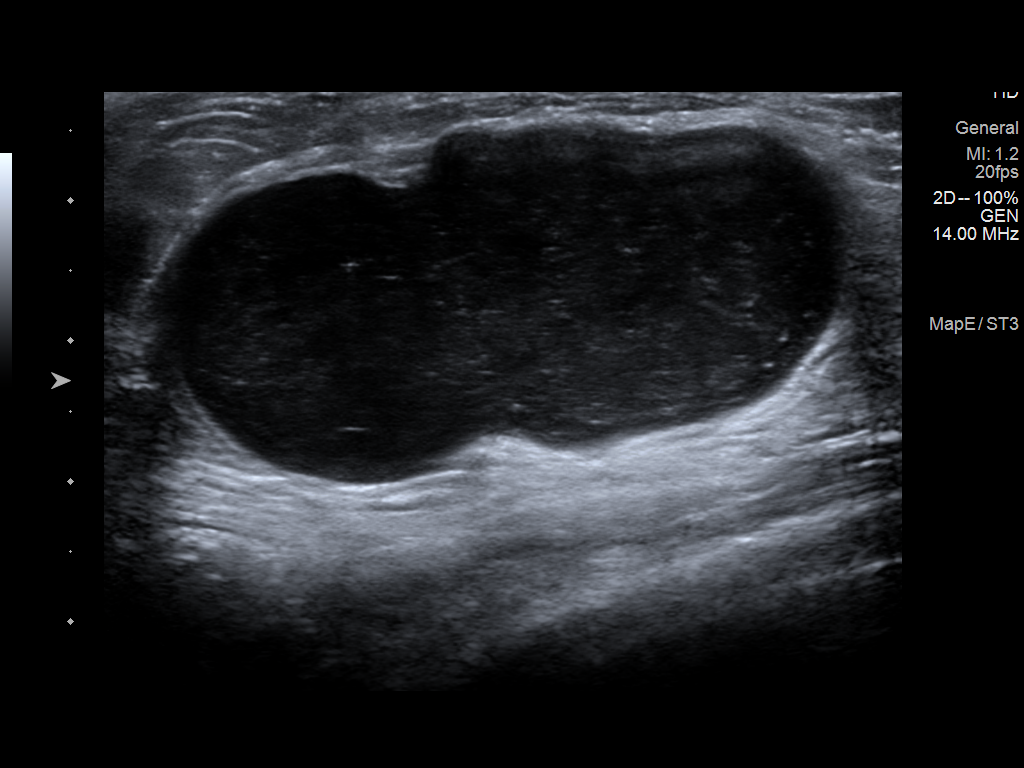
[im 5/5]
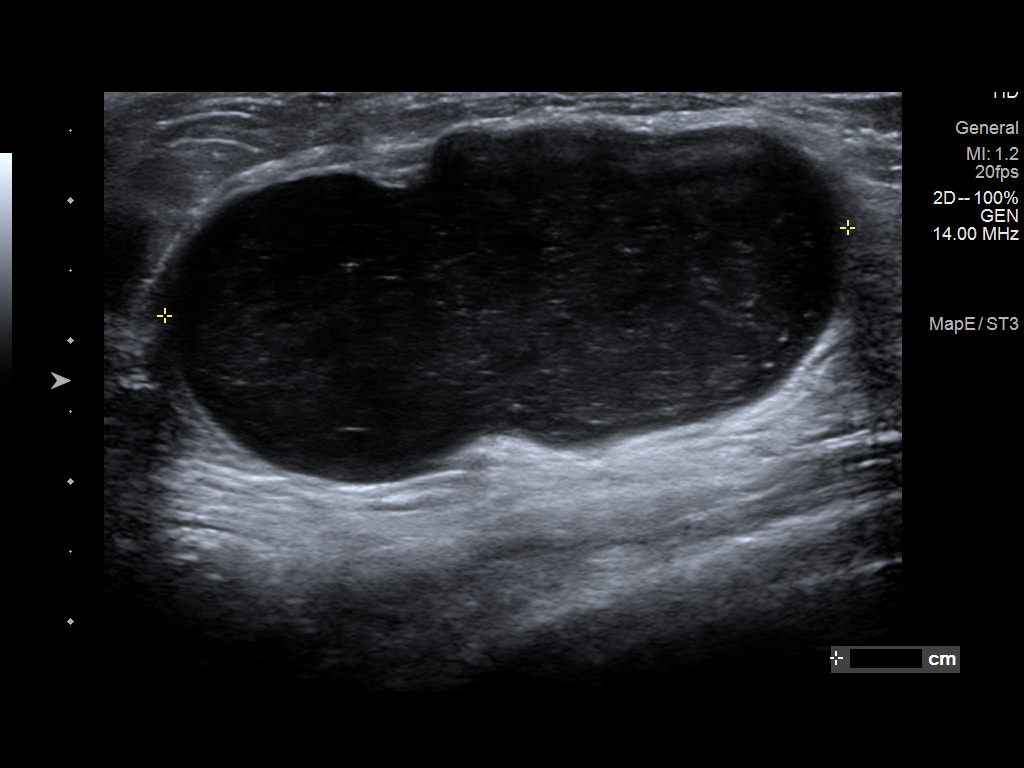

[5 of 5 positions shown; findings below may reference images not displayed]

ACR Breast Density Category c: The breast tissue is heterogeneously
dense, which may obscure small masses.
FINDINGS: Tomograms were performed of the right breast. There is an oval
circumscribed mass at site of palpable concern in the upper-outer
right breast measuring approximately 5 cm.

Mammographic images were processed with CAD.

Physical examination at site of palpable concern in the outer right
breast reveals a firm highly mobile mass at the approximate 9 to 10
o'clock position.

Targeted ultrasound of the right breast was performed demonstrating
a large bilobed cyst at 10 o'clock 5 cm from nipple measuring 4.7 x
2.2 x 4.9 cm. Mobile debris is present within the cyst. This
corresponds well with the mass seen in the right breast at
mammography.
IMPRESSION: Large right breast cyst.  No findings of malignancy.

RECOMMENDATION:
1. Cyst aspiration was offered to the patient. If the patient
decides to proceed with cyst aspiration she will call the [REDACTED] to schedule this procedure.

2.  Annual routine screening mammography, due June 2019.

I have discussed the findings and recommendations with the patient.
Results were also provided in writing at the conclusion of the
visit. If applicable, a reminder letter will be sent to the patient
regarding the next appointment.

BI-RADS CATEGORY  2: Benign.

## 2020-12-01 IMAGING — MG DIGITAL DIAGNOSTIC UNILATERAL RIGHT MAMMOGRAM WITH TOMO AND CAD
6 series · 6 of 18 positions shown · non-contrast
Comparison: Previous exam(s).

CLINICAL DATA: 48-year-old female with a palpable abnormality in
the upper-outer right breast. The patient has history of cysts in
this location.

EXAM:
DIGITAL DIAGNOSTIC UNILATERAL RIGHT MAMMOGRAM WITH CAD AND TOMO
RIGHT BREAST ULTRASOUND

[R MLO synth-2D]
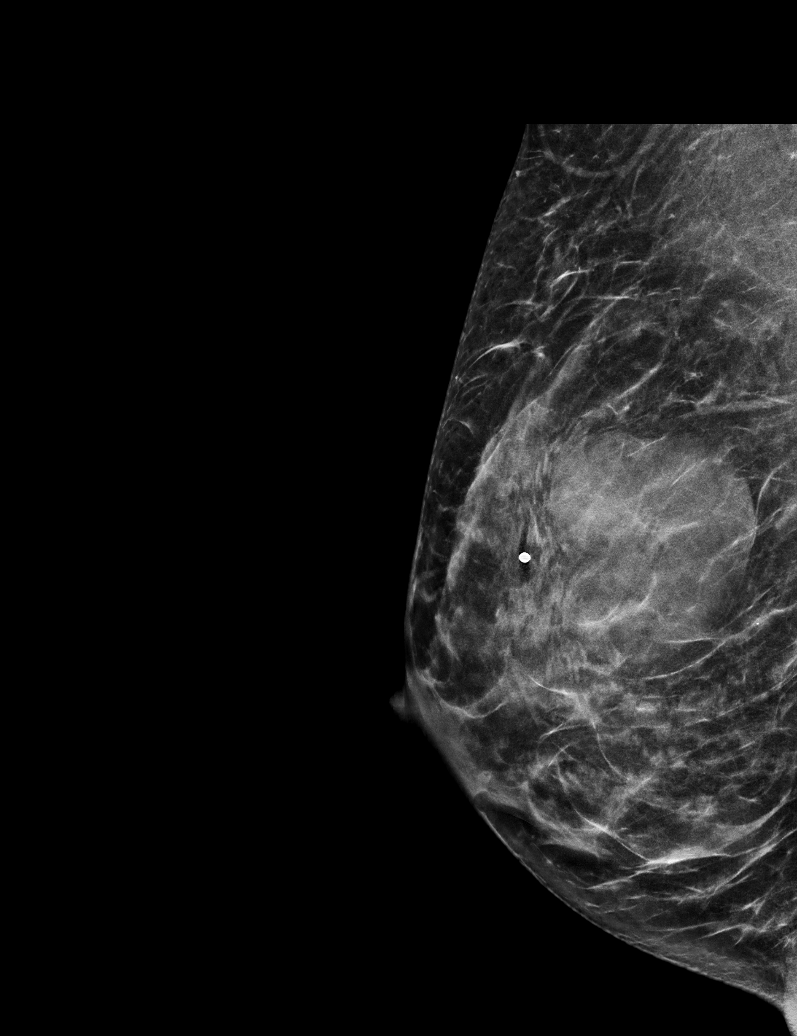

[R CC synth-2D (1 of 2)]
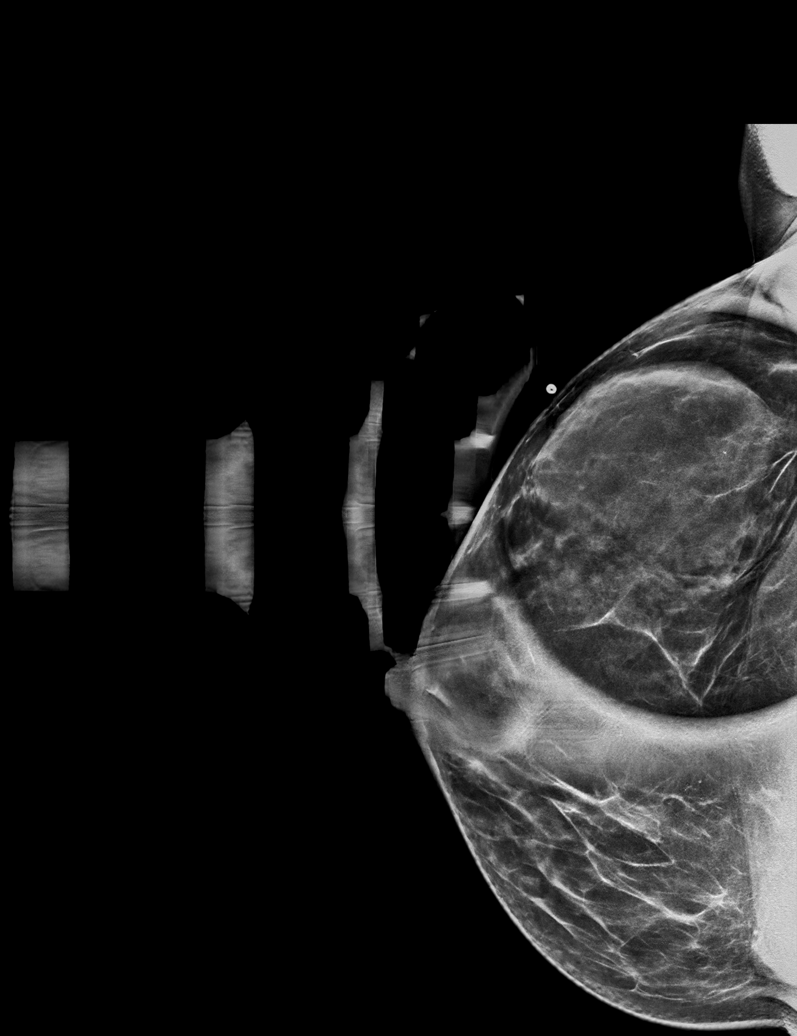

[R CC synth-2D (2 of 2)]
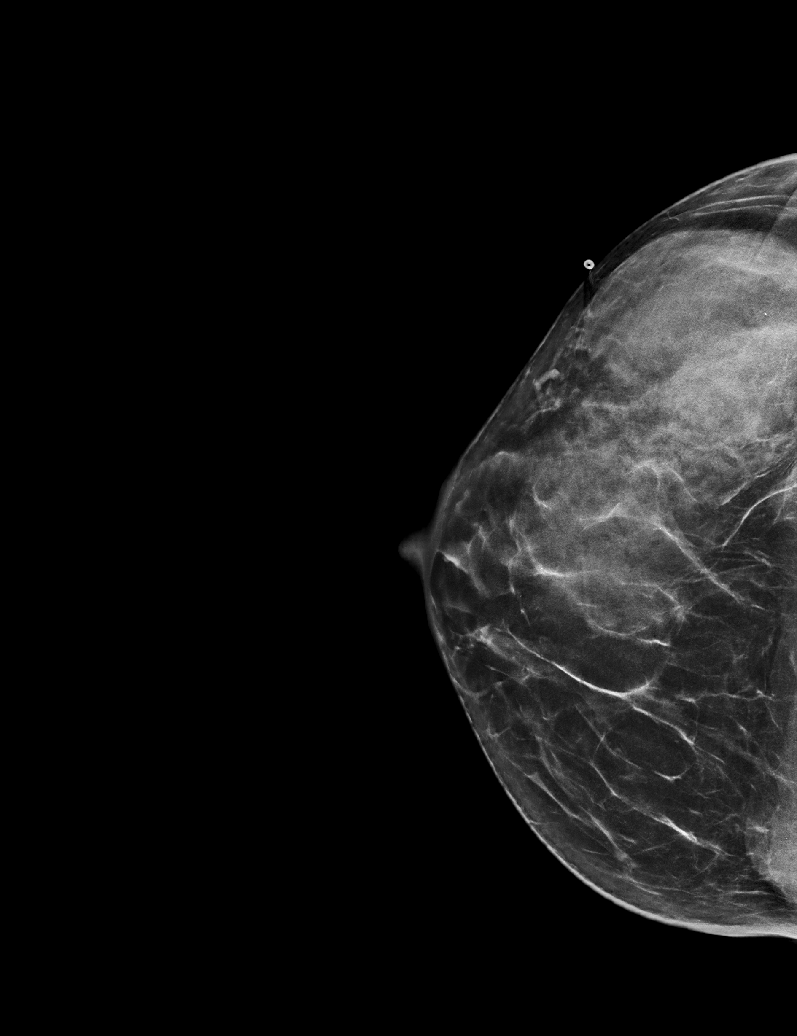

[R CC tomo (1 of 2) · tomo slice 31/61.0]
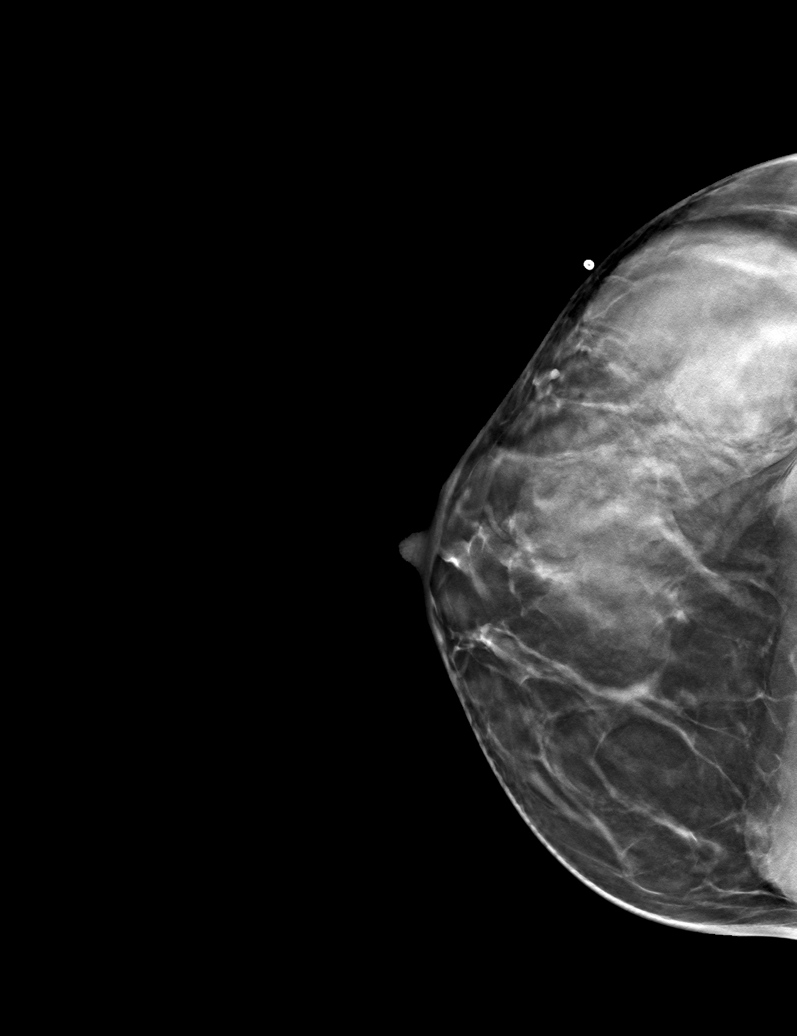

[R MLO tomo · tomo slice 27/54.0]
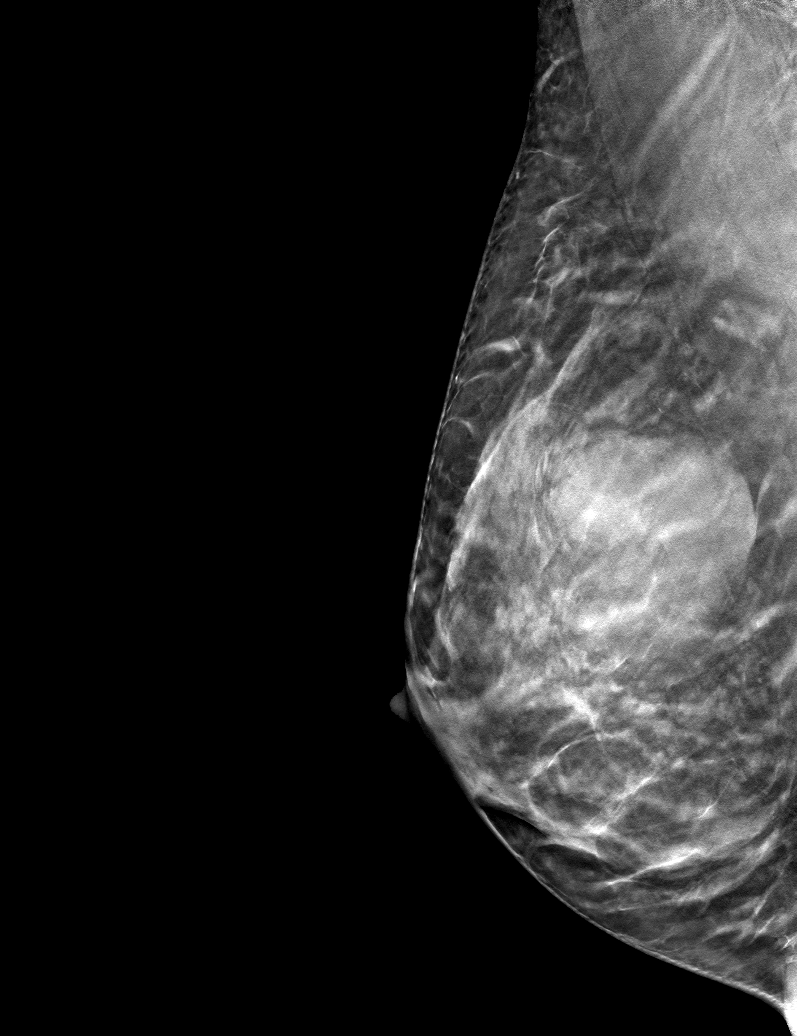

[R CC tomo (2 of 2) · tomo slice 30/59.0]
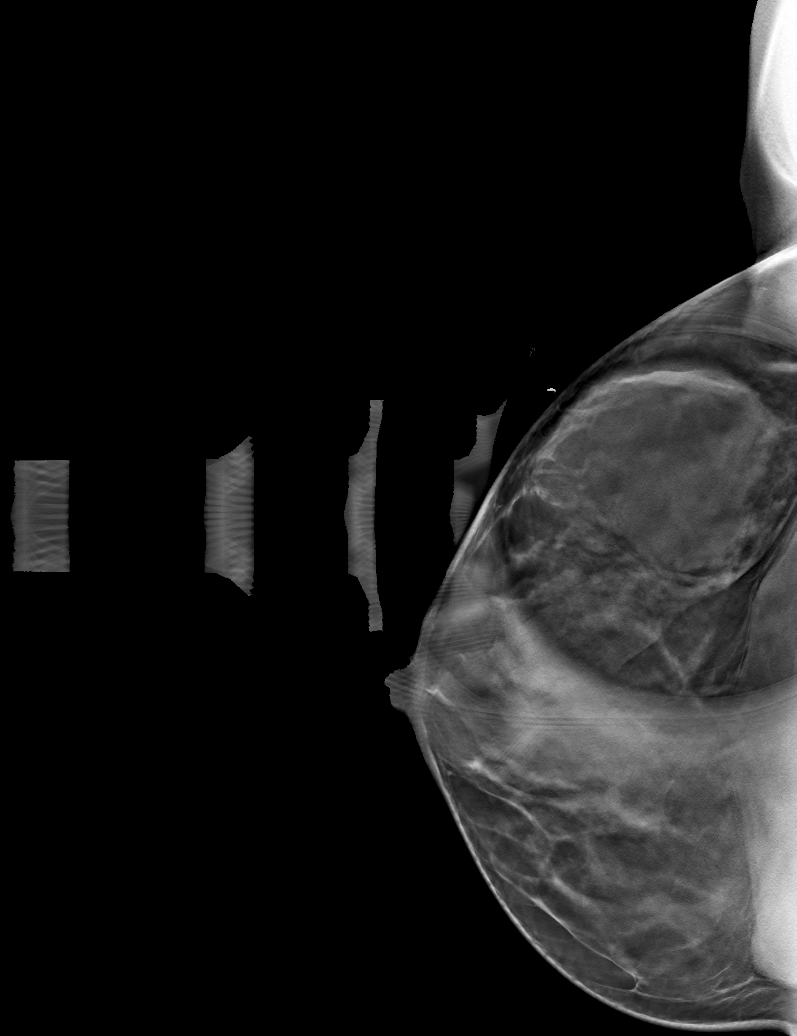

[6 of 18 positions shown; findings below may reference images not displayed]

ACR Breast Density Category c: The breast tissue is heterogeneously
dense, which may obscure small masses.
FINDINGS: Tomograms were performed of the right breast. There is an oval
circumscribed mass at site of palpable concern in the upper-outer
right breast measuring approximately 5 cm.

Mammographic images were processed with CAD.

Physical examination at site of palpable concern in the outer right
breast reveals a firm highly mobile mass at the approximate 9 to 10
o'clock position.

Targeted ultrasound of the right breast was performed demonstrating
a large bilobed cyst at 10 o'clock 5 cm from nipple measuring 4.7 x
2.2 x 4.9 cm. Mobile debris is present within the cyst. This
corresponds well with the mass seen in the right breast at
mammography.
IMPRESSION: Large right breast cyst.  No findings of malignancy.

RECOMMENDATION:
1. Cyst aspiration was offered to the patient. If the patient
decides to proceed with cyst aspiration she will call the [REDACTED] to schedule this procedure.

2.  Annual routine screening mammography, due June 2019.

I have discussed the findings and recommendations with the patient.
Results were also provided in writing at the conclusion of the
visit. If applicable, a reminder letter will be sent to the patient
regarding the next appointment.

BI-RADS CATEGORY  2: Benign.

## 2020-12-08 ENCOUNTER — Other Ambulatory Visit (HOSPITAL_COMMUNITY): Payer: Self-pay

## 2020-12-15 ENCOUNTER — Other Ambulatory Visit: Payer: Self-pay

## 2020-12-15 ENCOUNTER — Ambulatory Visit
Admission: RE | Admit: 2020-12-15 | Discharge: 2020-12-15 | Disposition: A | Discharge status: Home / Self Care | Payer: 59 | Source: Ambulatory Visit | Attending: Family Medicine | Admitting: Family Medicine

## 2020-12-15 DIAGNOSIS — Z1231 Encounter for screening mammogram for malignant neoplasm of breast: Secondary | ICD-10-CM

## 2020-12-28 ENCOUNTER — Other Ambulatory Visit (HOSPITAL_COMMUNITY): Payer: Self-pay

## 2020-12-28 MED ORDER — LEVOTHYROXINE SODIUM 50 MCG PO TABS
50.0000 ug | ORAL_TABLET | Freq: Every morning | ORAL | 1 refills | Status: DC
  Filled 2020-12-28: qty 90, 90d supply, fill #0
  Filled 2021-04-30: qty 90, 90d supply, fill #1

## 2020-12-28 MED FILL — Alprazolam Tab 0.25 MG: ORAL | 10 days supply | Qty: 10 | Fill #0 | Status: AC

## 2021-01-12 ENCOUNTER — Other Ambulatory Visit (HOSPITAL_COMMUNITY): Payer: Self-pay

## 2021-01-12 MED FILL — Sertraline HCl Tab 100 MG: ORAL | 90 days supply | Qty: 90 | Fill #0 | Status: AC

## 2021-01-17 ENCOUNTER — Other Ambulatory Visit (HOSPITAL_COMMUNITY): Payer: Self-pay

## 2021-01-30 ENCOUNTER — Other Ambulatory Visit (HOSPITAL_COMMUNITY): Payer: Self-pay

## 2021-01-30 DIAGNOSIS — L821 Other seborrheic keratosis: Secondary | ICD-10-CM | POA: Diagnosis not present

## 2021-01-30 DIAGNOSIS — L218 Other seborrheic dermatitis: Secondary | ICD-10-CM | POA: Diagnosis not present

## 2021-01-30 DIAGNOSIS — D225 Melanocytic nevi of trunk: Secondary | ICD-10-CM | POA: Diagnosis not present

## 2021-01-30 DIAGNOSIS — D2271 Melanocytic nevi of right lower limb, including hip: Secondary | ICD-10-CM | POA: Diagnosis not present

## 2021-01-30 DIAGNOSIS — L738 Other specified follicular disorders: Secondary | ICD-10-CM | POA: Diagnosis not present

## 2021-01-30 MED ORDER — HYDROCORTISONE 2.5 % EX CREA
1.0000 "application " | TOPICAL_CREAM | Freq: Two times a day (BID) | CUTANEOUS | 11 refills | Status: DC
  Filled 2021-01-30: qty 60, 30d supply, fill #0

## 2021-01-30 MED ORDER — KETOCONAZOLE 2 % EX CREA
1.0000 "application " | TOPICAL_CREAM | Freq: Two times a day (BID) | CUTANEOUS | 11 refills | Status: DC
  Filled 2021-01-30: qty 60, 30d supply, fill #0

## 2021-02-20 ENCOUNTER — Other Ambulatory Visit (HOSPITAL_COMMUNITY): Payer: Self-pay

## 2021-02-20 MED FILL — Alprazolam Tab 0.25 MG: ORAL | 10 days supply | Qty: 10 | Fill #1 | Status: AC

## 2021-03-02 ENCOUNTER — Other Ambulatory Visit (HOSPITAL_COMMUNITY): Payer: Self-pay

## 2021-03-02 MED FILL — Diclofenac Sodium Tab Delayed Release 75 MG: ORAL | 30 days supply | Qty: 60 | Fill #0 | Status: AC

## 2021-03-13 ENCOUNTER — Other Ambulatory Visit (HOSPITAL_COMMUNITY): Payer: Self-pay

## 2021-03-13 MED ORDER — METHOCARBAMOL 500 MG PO TABS
500.0000 mg | ORAL_TABLET | Freq: Four times a day (QID) | ORAL | 0 refills | Status: DC
  Filled 2021-03-13: qty 45, 12d supply, fill #0

## 2021-03-28 DIAGNOSIS — F419 Anxiety disorder, unspecified: Secondary | ICD-10-CM | POA: Diagnosis not present

## 2021-03-28 DIAGNOSIS — E039 Hypothyroidism, unspecified: Secondary | ICD-10-CM | POA: Diagnosis not present

## 2021-03-28 DIAGNOSIS — J309 Allergic rhinitis, unspecified: Secondary | ICD-10-CM | POA: Diagnosis not present

## 2021-03-28 DIAGNOSIS — E785 Hyperlipidemia, unspecified: Secondary | ICD-10-CM | POA: Diagnosis not present

## 2021-03-30 ENCOUNTER — Other Ambulatory Visit (HOSPITAL_COMMUNITY): Payer: Self-pay

## 2021-03-30 MED FILL — Diclofenac Sodium Tab Delayed Release 75 MG: ORAL | 30 days supply | Qty: 60 | Fill #1 | Status: AC

## 2021-04-05 ENCOUNTER — Other Ambulatory Visit (HOSPITAL_BASED_OUTPATIENT_CLINIC_OR_DEPARTMENT_OTHER): Payer: Self-pay

## 2021-04-05 ENCOUNTER — Other Ambulatory Visit: Payer: Self-pay

## 2021-04-05 ENCOUNTER — Ambulatory Visit: Payer: 59 | Attending: Internal Medicine

## 2021-04-05 DIAGNOSIS — E785 Hyperlipidemia, unspecified: Secondary | ICD-10-CM | POA: Diagnosis not present

## 2021-04-05 DIAGNOSIS — Z23 Encounter for immunization: Secondary | ICD-10-CM

## 2021-04-05 DIAGNOSIS — E039 Hypothyroidism, unspecified: Secondary | ICD-10-CM | POA: Diagnosis not present

## 2021-04-05 MED ORDER — PFIZER COVID-19 VAC BIVALENT 30 MCG/0.3ML IM SUSP
INTRAMUSCULAR | 0 refills | Status: AC
  Filled 2021-04-05: qty 0.3, 1d supply, fill #0

## 2021-04-05 NOTE — Progress Notes (Signed)
   Covid-19 Vaccination Clinic  Name:  Sophia Perez    MRN: [id number] DOB: 20-Jul-1970  04/05/2021  [name] was observed post Covid-19 immunization for 15 minutes without incident. She was provided with Vaccine Information Sheet and instruction to access the V-Safe system.   [name] was instructed to call 911 with any severe reactions post vaccine: Difficulty breathing  Swelling of face and throat  A fast heartbeat  A bad rash all over body  Dizziness and weakness   Immunizations Administered     Name Date Dose VIS Date Route   Pfizer Covid-19 Vaccine Bivalent Booster 04/05/2021  2:50 PM 0.3 mL 02/14/2021 Intramuscular   Manufacturer: [place]   Lot: [id number]   [place]: [phone]

## 2021-04-30 ENCOUNTER — Other Ambulatory Visit (HOSPITAL_COMMUNITY): Payer: Self-pay

## 2021-04-30 MED ORDER — SERTRALINE HCL 100 MG PO TABS
100.0000 mg | ORAL_TABLET | Freq: Every day | ORAL | 1 refills | Status: DC
  Filled 2021-04-30: qty 90, 90d supply, fill #0
  Filled 2021-08-30: qty 90, 90d supply, fill #1

## 2021-06-22 ENCOUNTER — Other Ambulatory Visit (HOSPITAL_COMMUNITY): Payer: Self-pay

## 2021-06-25 DIAGNOSIS — T7840XA Allergy, unspecified, initial encounter: Secondary | ICD-10-CM | POA: Insufficient documentation

## 2021-06-25 DIAGNOSIS — F419 Anxiety disorder, unspecified: Secondary | ICD-10-CM | POA: Insufficient documentation

## 2021-06-25 DIAGNOSIS — U071 COVID-19: Secondary | ICD-10-CM | POA: Insufficient documentation

## 2021-06-26 ENCOUNTER — Other Ambulatory Visit (HOSPITAL_COMMUNITY): Payer: Self-pay

## 2021-06-26 DIAGNOSIS — U071 COVID-19: Secondary | ICD-10-CM | POA: Diagnosis not present

## 2021-06-26 MED ORDER — HYDROCODONE BIT-HOMATROP MBR 5-1.5 MG/5ML PO SOLN
5.0000 mL | Freq: Every evening | ORAL | 0 refills | Status: DC | PRN
  Filled 2021-06-26: qty 50, 10d supply, fill #0

## 2021-07-09 ENCOUNTER — Other Ambulatory Visit (HOSPITAL_COMMUNITY): Payer: Self-pay

## 2021-08-18 DIAGNOSIS — J019 Acute sinusitis, unspecified: Secondary | ICD-10-CM | POA: Insufficient documentation

## 2021-08-30 ENCOUNTER — Other Ambulatory Visit (HOSPITAL_COMMUNITY): Payer: Self-pay

## 2021-08-30 MED ORDER — LEVOTHYROXINE SODIUM 50 MCG PO TABS
50.0000 ug | ORAL_TABLET | Freq: Every morning | ORAL | 1 refills | Status: DC
  Filled 2021-08-30: qty 90, 90d supply, fill #0
  Filled 2022-04-08: qty 90, 90d supply, fill #1

## 2021-08-31 ENCOUNTER — Other Ambulatory Visit (HOSPITAL_COMMUNITY): Payer: Self-pay

## 2021-09-03 ENCOUNTER — Other Ambulatory Visit (HOSPITAL_COMMUNITY): Payer: Self-pay

## 2021-09-03 MED ORDER — ALPRAZOLAM 0.25 MG PO TABS
0.2500 mg | ORAL_TABLET | Freq: Every day | ORAL | 2 refills | Status: DC | PRN
  Filled 2021-09-03: qty 10, 10d supply, fill #0
  Filled 2021-12-14: qty 10, 10d supply, fill #1
  Filled 2022-02-22: qty 10, 10d supply, fill #2

## 2021-09-21 ENCOUNTER — Other Ambulatory Visit (HOSPITAL_COMMUNITY): Payer: Self-pay

## 2021-09-24 ENCOUNTER — Other Ambulatory Visit (HOSPITAL_COMMUNITY): Payer: Self-pay

## 2021-09-25 ENCOUNTER — Other Ambulatory Visit (HOSPITAL_COMMUNITY): Payer: Self-pay

## 2021-09-26 ENCOUNTER — Other Ambulatory Visit (HOSPITAL_COMMUNITY): Payer: Self-pay

## 2021-09-26 MED ORDER — FLUTICASONE-SALMETEROL 250-50 MCG/ACT IN AEPB
1.0000 | INHALATION_SPRAY | Freq: Two times a day (BID) | RESPIRATORY_TRACT | 5 refills | Status: DC
  Filled 2021-09-26: qty 60, 30d supply, fill #0
  Filled 2021-12-31: qty 60, 30d supply, fill #1

## 2021-09-27 ENCOUNTER — Other Ambulatory Visit (HOSPITAL_COMMUNITY): Payer: Self-pay

## 2021-09-27 MED ORDER — FLUTICASONE PROPIONATE 50 MCG/ACT NA SUSP
2.0000 | Freq: Every day | NASAL | 0 refills | Status: DC
  Filled 2021-09-27: qty 16, 30d supply, fill #0

## 2021-10-22 ENCOUNTER — Other Ambulatory Visit (HOSPITAL_COMMUNITY): Payer: Self-pay

## 2021-10-22 MED ORDER — DICLOFENAC SODIUM 75 MG PO TBEC
75.0000 mg | DELAYED_RELEASE_TABLET | Freq: Two times a day (BID) | ORAL | 3 refills | Status: DC
  Filled 2021-10-22: qty 60, 30d supply, fill #0

## 2021-10-22 MED ORDER — METHOCARBAMOL 500 MG PO TABS
500.0000 mg | ORAL_TABLET | Freq: Four times a day (QID) | ORAL | 2 refills | Status: DC
  Filled 2021-10-22: qty 45, 12d supply, fill #0
  Filled 2022-06-18: qty 45, 12d supply, fill #1

## 2021-11-27 DIAGNOSIS — F419 Anxiety disorder, unspecified: Secondary | ICD-10-CM | POA: Diagnosis not present

## 2021-11-27 DIAGNOSIS — Z23 Encounter for immunization: Secondary | ICD-10-CM | POA: Diagnosis not present

## 2021-11-27 DIAGNOSIS — Z5181 Encounter for therapeutic drug level monitoring: Secondary | ICD-10-CM | POA: Diagnosis not present

## 2021-11-27 DIAGNOSIS — E785 Hyperlipidemia, unspecified: Secondary | ICD-10-CM | POA: Diagnosis not present

## 2021-11-27 DIAGNOSIS — E039 Hypothyroidism, unspecified: Secondary | ICD-10-CM | POA: Diagnosis not present

## 2021-11-27 DIAGNOSIS — Z0000 Encounter for general adult medical examination without abnormal findings: Secondary | ICD-10-CM | POA: Diagnosis not present

## 2021-12-14 ENCOUNTER — Other Ambulatory Visit (HOSPITAL_COMMUNITY): Payer: Self-pay

## 2021-12-14 MED ORDER — SERTRALINE HCL 100 MG PO TABS
100.0000 mg | ORAL_TABLET | Freq: Every day | ORAL | 2 refills | Status: DC
  Filled 2021-12-14: qty 90, 90d supply, fill #0
  Filled 2022-04-08: qty 90, 90d supply, fill #1
  Filled 2022-07-18: qty 90, 90d supply, fill #2

## 2021-12-25 DIAGNOSIS — M25512 Pain in left shoulder: Secondary | ICD-10-CM | POA: Diagnosis not present

## 2021-12-31 ENCOUNTER — Other Ambulatory Visit (HOSPITAL_COMMUNITY): Payer: Self-pay

## 2022-02-22 ENCOUNTER — Other Ambulatory Visit (HOSPITAL_COMMUNITY): Payer: Self-pay

## 2022-03-13 DIAGNOSIS — M549 Dorsalgia, unspecified: Secondary | ICD-10-CM | POA: Insufficient documentation

## 2022-03-13 DIAGNOSIS — Z760 Encounter for issue of repeat prescription: Secondary | ICD-10-CM | POA: Insufficient documentation

## 2022-03-29 ENCOUNTER — Other Ambulatory Visit: Payer: Self-pay | Admitting: Family Medicine

## 2022-03-29 DIAGNOSIS — Z1231 Encounter for screening mammogram for malignant neoplasm of breast: Secondary | ICD-10-CM

## 2022-03-29 DIAGNOSIS — Z23 Encounter for immunization: Secondary | ICD-10-CM | POA: Diagnosis not present

## 2022-04-08 ENCOUNTER — Other Ambulatory Visit (HOSPITAL_COMMUNITY): Payer: Self-pay

## 2022-04-08 ENCOUNTER — Ambulatory Visit: Payer: 59

## 2022-05-21 ENCOUNTER — Ambulatory Visit
Admission: RE | Admit: 2022-05-21 | Discharge: 2022-05-21 | Disposition: A | Discharge status: Home / Self Care | Payer: 59 | Source: Ambulatory Visit | Attending: Family Medicine | Admitting: Family Medicine

## 2022-05-21 DIAGNOSIS — Z1231 Encounter for screening mammogram for malignant neoplasm of breast: Secondary | ICD-10-CM

## 2022-05-27 DIAGNOSIS — E785 Hyperlipidemia, unspecified: Secondary | ICD-10-CM | POA: Diagnosis not present

## 2022-05-27 DIAGNOSIS — F419 Anxiety disorder, unspecified: Secondary | ICD-10-CM | POA: Diagnosis not present

## 2022-05-27 DIAGNOSIS — J45909 Unspecified asthma, uncomplicated: Secondary | ICD-10-CM | POA: Diagnosis not present

## 2022-05-27 DIAGNOSIS — E039 Hypothyroidism, unspecified: Secondary | ICD-10-CM | POA: Diagnosis not present

## 2022-05-28 ENCOUNTER — Other Ambulatory Visit (HOSPITAL_COMMUNITY): Payer: Self-pay

## 2022-05-28 MED ORDER — LEVOTHYROXINE SODIUM 50 MCG PO TABS
50.0000 ug | ORAL_TABLET | Freq: Every morning | ORAL | 2 refills | Status: DC
  Filled 2022-05-28: qty 90, 90d supply, fill #0

## 2022-05-28 MED ORDER — FLUTICASONE-SALMETEROL 250-50 MCG/ACT IN AEPB
1.0000 | INHALATION_SPRAY | Freq: Two times a day (BID) | RESPIRATORY_TRACT | 5 refills | Status: AC
  Filled 2022-05-28: qty 60, 30d supply, fill #0
  Filled 2022-09-27: qty 60, 30d supply, fill #1
  Filled 2022-12-26: qty 60, 30d supply, fill #2
  Filled 2023-03-26: qty 60, 30d supply, fill #3
  Filled 2023-05-27: qty 60, 30d supply, fill #4

## 2022-06-18 ENCOUNTER — Other Ambulatory Visit (HOSPITAL_COMMUNITY): Payer: Self-pay

## 2022-06-27 DIAGNOSIS — E039 Hypothyroidism, unspecified: Secondary | ICD-10-CM | POA: Diagnosis not present

## 2022-06-27 DIAGNOSIS — E785 Hyperlipidemia, unspecified: Secondary | ICD-10-CM | POA: Diagnosis not present

## 2022-07-02 ENCOUNTER — Other Ambulatory Visit (HOSPITAL_COMMUNITY): Payer: Self-pay

## 2022-07-02 MED ORDER — LEVOTHYROXINE SODIUM 75 MCG PO TABS
75.0000 ug | ORAL_TABLET | Freq: Every morning | ORAL | 1 refills | Status: DC
  Filled 2022-07-02: qty 90, 90d supply, fill #0
  Filled 2022-09-27: qty 90, 90d supply, fill #1

## 2022-07-02 MED ORDER — ATORVASTATIN CALCIUM 10 MG PO TABS
10.0000 mg | ORAL_TABLET | Freq: Every day | ORAL | 3 refills | Status: DC
  Filled 2022-07-02: qty 90, 90d supply, fill #0
  Filled 2022-09-27: qty 90, 90d supply, fill #1
  Filled 2022-12-26: qty 90, 90d supply, fill #2
  Filled 2023-03-26: qty 90, 90d supply, fill #3

## 2022-07-03 ENCOUNTER — Other Ambulatory Visit (HOSPITAL_COMMUNITY): Payer: Self-pay

## 2022-07-18 ENCOUNTER — Other Ambulatory Visit (HOSPITAL_COMMUNITY): Payer: Self-pay

## 2022-07-18 MED ORDER — ALPRAZOLAM 0.25 MG PO TABS
0.2500 mg | ORAL_TABLET | Freq: Every day | ORAL | 2 refills | Status: AC | PRN
  Filled 2022-07-18: qty 10, 10d supply, fill #0
  Filled 2022-09-27: qty 10, 10d supply, fill #1
  Filled 2022-11-29: qty 10, 10d supply, fill #2

## 2022-08-26 ENCOUNTER — Telehealth: Payer: Commercial Managed Care - PPO | Admitting: Nurse Practitioner

## 2022-08-26 ENCOUNTER — Other Ambulatory Visit (HOSPITAL_COMMUNITY): Payer: Self-pay

## 2022-08-26 DIAGNOSIS — B379 Candidiasis, unspecified: Secondary | ICD-10-CM

## 2022-08-26 DIAGNOSIS — T3695XA Adverse effect of unspecified systemic antibiotic, initial encounter: Secondary | ICD-10-CM

## 2022-08-26 DIAGNOSIS — J0140 Acute pansinusitis, unspecified: Secondary | ICD-10-CM

## 2022-08-26 MED ORDER — AMOXICILLIN-POT CLAVULANATE 875-125 MG PO TABS
1.0000 | ORAL_TABLET | Freq: Two times a day (BID) | ORAL | 0 refills | Status: AC
  Filled 2022-08-26: qty 14, 7d supply, fill #0

## 2022-08-26 MED ORDER — FLUCONAZOLE 150 MG PO TABS
150.0000 mg | ORAL_TABLET | Freq: Once | ORAL | 0 refills | Status: AC
  Filled 2022-08-26: qty 1, 1d supply, fill #0

## 2022-08-26 NOTE — Progress Notes (Signed)
E-Visit for Sinus Problems  We are sorry that you are not feeling well.  Here is how we plan to help!  Based on what you have shared with me it looks like you have sinusitis.  Sinusitis is inflammation and infection in the sinus cavities of the head.  Based on your presentation I believe you most likely have Acute Bacterial Sinusitis.  This is an infection caused by bacteria and is treated with antibiotics. I have prescribed Augmentin '875mg'$ /'125mg'$  one tablet twice daily with food, for 7 days. You may use an oral decongestant such as Mucinex D or if you have glaucoma or high blood pressure use plain Mucinex. Saline nasal spray help and can safely be used as often as needed for congestion.  If you develop worsening sinus pain, fever or notice severe headache and vision changes, or if symptoms are not better after completion of antibiotic, please schedule an appointment with a health care provider.    Sinus infections are not as easily transmitted as other respiratory infection, however we still recommend that you avoid close contact with loved ones, especially the very young and elderly.  Remember to wash your hands thoroughly throughout the day as this is the number one way to prevent the spread of infection!  Home Care: Only take medications as instructed by your medical team. Complete the entire course of an antibiotic. Do not take these medications with alcohol. A steam or ultrasonic humidifier can help congestion.  You can place a towel over your head and breathe in the steam from hot water coming from a faucet. Avoid close contacts especially the very young and the elderly. Cover your mouth when you cough or sneeze. Always remember to wash your hands.  Get Help Right Away If: You develop worsening fever or sinus pain. You develop a severe head ache or visual changes. Your symptoms persist after you have completed your treatment plan.  Make sure you Understand these instructions. Will watch  your condition. Will get help right away if you are not doing well or get worse.  Thank you for choosing an e-visit.  Your e-visit answers were reviewed by a board certified advanced clinical practitioner to complete your personal care plan. Depending upon the condition, your plan could have included both over the counter or prescription medications.  Please review your pharmacy choice. Make sure the pharmacy is open so you can pick up prescription now. If there is a problem, you may contact your provider through [employer] and have the prescription routed to another pharmacy.  Your safety is important to [place]. If you have drug allergies check your prescription carefully.   For the next 24 hours you can use MyChart to ask questions about today's visit, request a non-urgent call back, or ask for a work or school excuse. You will get an email in the next two days asking about your experience. I hope that your e-visit has been valuable and will speed your recovery.   Meds ordered this encounter  Medications   amoxicillin-clavulanate (AUGMENTIN) 875-125 MG tablet    Sig: Take 1 tablet by mouth 2 (two) times daily for 7 days. Take with food    Dispense:  14 tablet    Refill:  0   fluconazole (DIFLUCAN) 150 MG tablet    Sig: Take 1 tablet (150 mg total) by mouth once for 1 dose.    Dispense:  1 tablet    Refill:  0     I spent approximately 5  minutes reviewing the patient's history, current symptoms and coordinating their care today.

## 2022-09-25 ENCOUNTER — Other Ambulatory Visit (HOSPITAL_COMMUNITY): Payer: Self-pay

## 2022-09-25 DIAGNOSIS — H9319 Tinnitus, unspecified ear: Secondary | ICD-10-CM | POA: Diagnosis not present

## 2022-09-25 DIAGNOSIS — J309 Allergic rhinitis, unspecified: Secondary | ICD-10-CM | POA: Diagnosis not present

## 2022-09-25 DIAGNOSIS — E785 Hyperlipidemia, unspecified: Secondary | ICD-10-CM | POA: Diagnosis not present

## 2022-09-25 DIAGNOSIS — E039 Hypothyroidism, unspecified: Secondary | ICD-10-CM | POA: Diagnosis not present

## 2022-09-25 MED ORDER — PREDNISONE 10 MG PO TABS
ORAL_TABLET | ORAL | 0 refills | Status: AC
  Filled 2022-09-25: qty 21, 6d supply, fill #0

## 2022-09-27 ENCOUNTER — Other Ambulatory Visit (HOSPITAL_COMMUNITY): Payer: Self-pay

## 2022-09-27 ENCOUNTER — Other Ambulatory Visit: Payer: Self-pay

## 2022-10-23 ENCOUNTER — Other Ambulatory Visit (HOSPITAL_COMMUNITY): Payer: Self-pay

## 2022-10-23 MED ORDER — SERTRALINE HCL 100 MG PO TABS
100.0000 mg | ORAL_TABLET | Freq: Every day | ORAL | 2 refills | Status: AC
  Filled 2022-10-23: qty 90, 90d supply, fill #0
  Filled 2022-12-26 – 2023-01-21 (×2): qty 90, 90d supply, fill #1
  Filled 2023-05-27: qty 90, 90d supply, fill #2

## 2022-11-12 ENCOUNTER — Other Ambulatory Visit (HOSPITAL_COMMUNITY): Payer: Self-pay

## 2022-11-12 ENCOUNTER — Other Ambulatory Visit: Payer: Self-pay

## 2022-11-12 DIAGNOSIS — Z9889 Other specified postprocedural states: Secondary | ICD-10-CM | POA: Diagnosis not present

## 2022-11-12 DIAGNOSIS — J339 Nasal polyp, unspecified: Secondary | ICD-10-CM | POA: Diagnosis not present

## 2022-11-12 DIAGNOSIS — J324 Chronic pansinusitis: Secondary | ICD-10-CM | POA: Diagnosis not present

## 2022-11-12 MED ORDER — PREDNISONE 10 MG PO TABS
ORAL_TABLET | ORAL | 0 refills | Status: AC
  Filled 2022-11-12: qty 24, 12d supply, fill #0

## 2022-11-12 MED ORDER — BUDESONIDE 0.5 MG/2ML IN SUSP
RESPIRATORY_TRACT | 11 refills | Status: DC
  Filled 2022-11-12: qty 60, 30d supply, fill #0
  Filled 2022-12-26: qty 60, 30d supply, fill #1
  Filled 2023-03-26: qty 60, 30d supply, fill #2
  Filled 2023-05-27: qty 60, 30d supply, fill #3
  Filled 2023-06-26: qty 60, 30d supply, fill #4
  Filled 2023-08-20: qty 60, 30d supply, fill #5
  Filled 2023-10-31: qty 60, 30d supply, fill #6

## 2022-11-12 MED ORDER — DOXYCYCLINE MONOHYDRATE 100 MG PO TABS
100.0000 mg | ORAL_TABLET | Freq: Two times a day (BID) | ORAL | 0 refills | Status: DC
  Filled 2022-11-12: qty 42, 21d supply, fill #0

## 2022-11-19 DIAGNOSIS — J3489 Other specified disorders of nose and nasal sinuses: Secondary | ICD-10-CM | POA: Diagnosis not present

## 2022-11-19 DIAGNOSIS — J339 Nasal polyp, unspecified: Secondary | ICD-10-CM | POA: Diagnosis not present

## 2022-11-19 DIAGNOSIS — J324 Chronic pansinusitis: Secondary | ICD-10-CM | POA: Diagnosis not present

## 2022-11-29 ENCOUNTER — Other Ambulatory Visit: Payer: Self-pay

## 2022-11-29 ENCOUNTER — Other Ambulatory Visit (HOSPITAL_COMMUNITY): Payer: Self-pay

## 2022-12-02 DIAGNOSIS — E039 Hypothyroidism, unspecified: Secondary | ICD-10-CM | POA: Diagnosis not present

## 2022-12-02 DIAGNOSIS — E785 Hyperlipidemia, unspecified: Secondary | ICD-10-CM | POA: Diagnosis not present

## 2022-12-02 DIAGNOSIS — Z0000 Encounter for general adult medical examination without abnormal findings: Secondary | ICD-10-CM | POA: Diagnosis not present

## 2022-12-02 DIAGNOSIS — F419 Anxiety disorder, unspecified: Secondary | ICD-10-CM | POA: Diagnosis not present

## 2022-12-26 ENCOUNTER — Other Ambulatory Visit (HOSPITAL_COMMUNITY): Payer: Self-pay

## 2022-12-26 MED ORDER — LEVOTHYROXINE SODIUM 75 MCG PO TABS
75.0000 ug | ORAL_TABLET | Freq: Every morning | ORAL | 1 refills | Status: DC
  Filled 2022-12-26: qty 90, 90d supply, fill #0
  Filled 2023-05-27: qty 90, 90d supply, fill #1

## 2022-12-27 ENCOUNTER — Other Ambulatory Visit: Payer: Self-pay

## 2023-01-01 DIAGNOSIS — J339 Nasal polyp, unspecified: Secondary | ICD-10-CM | POA: Diagnosis not present

## 2023-01-01 DIAGNOSIS — J324 Chronic pansinusitis: Secondary | ICD-10-CM | POA: Diagnosis not present

## 2023-01-01 DIAGNOSIS — Z9889 Other specified postprocedural states: Secondary | ICD-10-CM | POA: Diagnosis not present

## 2023-01-09 DIAGNOSIS — F4323 Adjustment disorder with mixed anxiety and depressed mood: Secondary | ICD-10-CM | POA: Diagnosis not present

## 2023-02-10 ENCOUNTER — Other Ambulatory Visit (HOSPITAL_COMMUNITY): Payer: Self-pay

## 2023-02-11 ENCOUNTER — Other Ambulatory Visit (HOSPITAL_COMMUNITY): Payer: Self-pay

## 2023-02-12 ENCOUNTER — Other Ambulatory Visit (HOSPITAL_COMMUNITY): Payer: Self-pay

## 2023-02-12 MED ORDER — ALPRAZOLAM 0.25 MG PO TABS
0.2500 mg | ORAL_TABLET | ORAL | 2 refills | Status: AC | PRN
  Filled 2023-02-12: qty 10, 10d supply, fill #0
  Filled 2023-03-26: qty 10, 10d supply, fill #1
  Filled 2023-06-16: qty 10, 10d supply, fill #2

## 2023-02-21 DIAGNOSIS — F4323 Adjustment disorder with mixed anxiety and depressed mood: Secondary | ICD-10-CM | POA: Diagnosis not present

## 2023-03-06 ENCOUNTER — Other Ambulatory Visit (HOSPITAL_COMMUNITY): Payer: Self-pay

## 2023-03-06 DIAGNOSIS — M26609 Unspecified temporomandibular joint disorder, unspecified side: Secondary | ICD-10-CM | POA: Diagnosis not present

## 2023-03-06 MED ORDER — METHOCARBAMOL 500 MG PO TABS
500.0000 mg | ORAL_TABLET | Freq: Four times a day (QID) | ORAL | 2 refills | Status: DC
  Filled 2023-03-06: qty 45, 12d supply, fill #0
  Filled 2023-07-16: qty 45, 12d supply, fill #1
  Filled 2023-10-07: qty 45, 12d supply, fill #2

## 2023-03-06 MED ORDER — DICLOFENAC SODIUM 75 MG PO TBEC
75.0000 mg | DELAYED_RELEASE_TABLET | Freq: Two times a day (BID) | ORAL | 3 refills | Status: DC
  Filled 2023-03-06: qty 60, 30d supply, fill #0
  Filled 2023-10-07: qty 60, 30d supply, fill #1
  Filled 2023-12-01: qty 60, 30d supply, fill #2

## 2023-03-26 ENCOUNTER — Other Ambulatory Visit: Payer: Self-pay

## 2023-04-01 ENCOUNTER — Other Ambulatory Visit (HOSPITAL_COMMUNITY): Payer: Self-pay

## 2023-04-01 DIAGNOSIS — E039 Hypothyroidism, unspecified: Secondary | ICD-10-CM | POA: Diagnosis not present

## 2023-04-01 DIAGNOSIS — F419 Anxiety disorder, unspecified: Secondary | ICD-10-CM | POA: Diagnosis not present

## 2023-04-01 DIAGNOSIS — R079 Chest pain, unspecified: Secondary | ICD-10-CM | POA: Diagnosis not present

## 2023-04-01 DIAGNOSIS — N951 Menopausal and female climacteric states: Secondary | ICD-10-CM | POA: Diagnosis not present

## 2023-04-01 MED ORDER — SERTRALINE HCL 100 MG PO TABS
150.0000 mg | ORAL_TABLET | Freq: Every day | ORAL | 2 refills | Status: DC
  Filled 2023-04-01: qty 135, 90d supply, fill #0
  Filled 2023-07-28: qty 135, 90d supply, fill #1
  Filled 2023-10-31: qty 135, 90d supply, fill #2

## 2023-05-01 DIAGNOSIS — H524 Presbyopia: Secondary | ICD-10-CM | POA: Diagnosis not present

## 2023-05-05 ENCOUNTER — Other Ambulatory Visit (HOSPITAL_COMMUNITY): Payer: Self-pay

## 2023-05-05 DIAGNOSIS — J029 Acute pharyngitis, unspecified: Secondary | ICD-10-CM | POA: Diagnosis not present

## 2023-05-05 DIAGNOSIS — R490 Dysphonia: Secondary | ICD-10-CM | POA: Diagnosis not present

## 2023-05-05 DIAGNOSIS — R509 Fever, unspecified: Secondary | ICD-10-CM | POA: Diagnosis not present

## 2023-05-05 DIAGNOSIS — R051 Acute cough: Secondary | ICD-10-CM | POA: Diagnosis not present

## 2023-05-05 MED ORDER — HYDROCODONE BIT-HOMATROP MBR 5-1.5 MG/5ML PO SOLN
5.0000 mL | Freq: Four times a day (QID) | ORAL | 0 refills | Status: DC | PRN
  Filled 2023-05-05: qty 100, 5d supply, fill #0

## 2023-05-07 ENCOUNTER — Other Ambulatory Visit (HOSPITAL_COMMUNITY): Payer: Self-pay

## 2023-05-07 DIAGNOSIS — J069 Acute upper respiratory infection, unspecified: Secondary | ICD-10-CM | POA: Diagnosis not present

## 2023-05-07 DIAGNOSIS — J040 Acute laryngitis: Secondary | ICD-10-CM | POA: Diagnosis not present

## 2023-05-07 DIAGNOSIS — J45909 Unspecified asthma, uncomplicated: Secondary | ICD-10-CM | POA: Diagnosis not present

## 2023-05-07 MED ORDER — ALBUTEROL SULFATE HFA 108 (90 BASE) MCG/ACT IN AERS
2.0000 | INHALATION_SPRAY | RESPIRATORY_TRACT | 0 refills | Status: AC | PRN
  Filled 2023-05-07: qty 6.7, 25d supply, fill #0

## 2023-05-07 MED ORDER — AMOXICILLIN 875 MG PO TABS
875.0000 mg | ORAL_TABLET | Freq: Two times a day (BID) | ORAL | 0 refills | Status: DC
  Filled 2023-05-07: qty 14, 7d supply, fill #0

## 2023-05-27 ENCOUNTER — Other Ambulatory Visit (HOSPITAL_COMMUNITY): Payer: Self-pay

## 2023-05-27 ENCOUNTER — Other Ambulatory Visit: Payer: Self-pay

## 2023-05-27 DIAGNOSIS — E039 Hypothyroidism, unspecified: Secondary | ICD-10-CM | POA: Diagnosis not present

## 2023-05-29 ENCOUNTER — Other Ambulatory Visit (HOSPITAL_COMMUNITY): Payer: Self-pay

## 2023-06-02 ENCOUNTER — Other Ambulatory Visit: Payer: Self-pay | Admitting: Family Medicine

## 2023-06-02 DIAGNOSIS — Z1231 Encounter for screening mammogram for malignant neoplasm of breast: Secondary | ICD-10-CM

## 2023-06-03 ENCOUNTER — Other Ambulatory Visit (HOSPITAL_COMMUNITY)
Admission: RE | Admit: 2023-06-03 | Discharge: 2023-06-03 | Disposition: A | Discharge status: Home / Self Care | Payer: Commercial Managed Care - PPO | Source: Ambulatory Visit | Attending: Radiology | Admitting: Radiology

## 2023-06-03 ENCOUNTER — Other Ambulatory Visit (HOSPITAL_COMMUNITY): Payer: Self-pay

## 2023-06-03 ENCOUNTER — Encounter: Payer: Self-pay | Admitting: Radiology

## 2023-06-03 ENCOUNTER — Ambulatory Visit: Payer: Commercial Managed Care - PPO | Admitting: Radiology

## 2023-06-03 VITALS — BP 112/70 | HR 85 | Ht 65.75 in | Wt 149.0 lb

## 2023-06-03 DIAGNOSIS — N816 Rectocele: Secondary | ICD-10-CM | POA: Diagnosis not present

## 2023-06-03 DIAGNOSIS — N8110 Cystocele, unspecified: Secondary | ICD-10-CM

## 2023-06-03 DIAGNOSIS — Z01419 Encounter for gynecological examination (general) (routine) without abnormal findings: Secondary | ICD-10-CM | POA: Insufficient documentation

## 2023-06-03 DIAGNOSIS — N951 Menopausal and female climacteric states: Secondary | ICD-10-CM | POA: Diagnosis not present

## 2023-06-03 MED ORDER — ESTRADIOL 0.05 MG/24HR TD PTTW
1.0000 | MEDICATED_PATCH | TRANSDERMAL | 2 refills | Status: DC
  Filled 2023-06-03: qty 8, 28d supply, fill #0
  Filled 2023-06-26: qty 8, 28d supply, fill #1
  Filled 2023-07-24: qty 8, 28d supply, fill #2

## 2023-06-03 NOTE — Progress Notes (Signed)
   Sophia Perez [birth year] [id number]   History:  52 y.o. G2P1 presents for annual exam.Complains of severe irritability, hot flashes, skin changes, difficulty concentrating. Hot flashes without sweating. Joint pain, fatigue. Interested in HRT. Hysterectomy 10 years ago for prolapse.Still has rectocele, often has to splint to have a BM. No other gyn concerns. Has a PCP, exercises regularly.   Gynecologic History Hysterectomy  Sexually active: yes, no concerns  Health Maintenance Last Pap: 2014. Results were: normal, hx of colpo and CIN1 per pt Last mammogram: 05/2022. Results were: normal, scheduled this week Last colonoscopy: age 38   Past medical history, past surgical history, family history and social history were all reviewed and documented in the EPIC chart.  ROS:  A ROS was performed and pertinent positives and negatives are included.  Exam:  Vitals:   06/03/23 1106  BP: 112/70  Pulse: 85  SpO2: 96%  Weight: 149 lb (67.6 kg)  Height: 5' 5.75" (1.67 m)   Body mass index is 24.23 kg/m.  General appearance:  Normal Thyroid:  Symmetrical, normal in size, without palpable masses or nodularity. Respiratory  Auscultation:  Clear without wheezing or rhonchi Cardiovascular  Auscultation:  Regular rate, without rubs, murmurs or gallops  Edema/varicosities:  Not grossly evident Abdominal  Soft,nontender, without masses, guarding or rebound.  Liver/spleen:  No organomegaly noted  Hernia:  None appreciated  Skin  Inspection:  Grossly normal Breasts: Examined lying and sitting.   Right: Without masses, retractions, nipple discharge or axillary adenopathy.   Left: Without masses, retractions, nipple discharge or axillary adenopathy. Genitourinary   Inguinal/mons:  Normal without inguinal adenopathy  External genitalia:  Normal appearing vulva with no masses, tenderness, or lesions  BUS/Urethra/Skene's glands:  Normal  Vagina:  Cystocele grade 2 with valsalva,  grade 2 rectocele, otherwise normal appearing with normal color and discharge, no lesions. Atrophy mild  Cervix:  absent  Uterus:  absent  Adnexa/parametria:     Rt: Normal in size, without masses or tenderness.   Lt: Normal in size, without masses or tenderness.  Anus and perineum: Normal   Sophia Perez, CMA present for exam  Assessment/Plan:   1. Well woman exam with routine gynecological exam (Primary) - Cytology - PAP( Sophia Perez)  2. Menopausal symptoms Risks and benefits reviewed - estradiol (VIVELLE-DOT) 0.05 MG/24HR patch; Place 1 patch (0.05 mg total) onto the skin 2 (two) times a week.  Dispense: 8 patch; Refill: 2  3. Cystocele with rectocele  Currently not bothersome, recommend pelvic floor strengthening. Will refer to urogyn if it becomes bothersome.  Follow up in 3 months Discussed SBE, colonoscopy and DEXA screening as appropriate. Encouraged 127mins/week of cardiovascular and weight bearing exercise minimum.     Arlie Solomons B WHNP-BC 11:35 AM 06/03/2023

## 2023-06-03 NOTE — Patient Instructions (Signed)
Preventive Care 40-51 Years Old, Female Preventive care refers to lifestyle choices and visits with your health care provider that can promote health and wellness. Preventive care visits are also called wellness exams. What can I expect for my preventive care visit? Counseling Your health care provider may ask you questions about your: Medical history, including: Past medical problems. Family medical history. Pregnancy history. Current health, including: Menstrual cycle. Method of birth control. Emotional well-being. Home life and relationship well-being. Sexual activity and sexual health. Lifestyle, including: Alcohol, nicotine or tobacco, and drug use. Access to firearms. Diet, exercise, and sleep habits. Work and work environment. Sunscreen use. Safety issues such as seatbelt and bike helmet use. Physical exam Your health care provider will check your: Height and weight. These may be used to calculate your BMI (body mass index). BMI is a measurement that tells if you are at a healthy weight. Waist circumference. This measures the distance around your waistline. This measurement also tells if you are at a healthy weight and may help predict your risk of certain diseases, such as type 2 diabetes and high blood pressure. Heart rate and blood pressure. Body temperature. Skin for abnormal spots. What immunizations do I need?  Vaccines are usually given at various ages, according to a schedule. Your health care provider will recommend vaccines for you based on your age, medical history, and lifestyle or other factors, such as travel or where you work. What tests do I need? Screening Your health care provider may recommend screening tests for certain conditions. This may include: Lipid and cholesterol levels. Diabetes screening. This is done by checking your blood sugar (glucose) after you have not eaten for a while (fasting). Pelvic exam and Pap test. Hepatitis B test. Hepatitis C  test. HIV (human immunodeficiency virus) test. STI (sexually transmitted infection) testing, if you are at risk. Lung cancer screening. Colorectal cancer screening. Mammogram. Talk with your health care provider about when you should start having regular mammograms. This may depend on whether you have a family history of breast cancer. BRCA-related cancer screening. This may be done if you have a family history of breast, ovarian, tubal, or peritoneal cancers. Bone density scan. This is done to screen for osteoporosis. Talk with your health care provider about your test results, treatment options, and if necessary, the need for more tests. Follow these instructions at home: Eating and drinking  Eat a diet that includes fresh fruits and vegetables, whole grains, lean protein, and low-fat dairy products. Take vitamin and mineral supplements as recommended by your health care provider. Do not drink alcohol if: Your health care provider tells you not to drink. You are pregnant, may be pregnant, or are planning to become pregnant. If you drink alcohol: Limit how much you have to 0-1 drink a day. Know how much alcohol is in your drink. In the [place] one drink equals one 12 oz bottle of beer (355 mL), one 5 oz glass of wine (148 mL), or one 1 oz glass of hard liquor (44 mL). Lifestyle Brush your teeth every morning and night with fluoride toothpaste. Floss one time each day. Exercise for at least 30 minutes 5 or more days each week. Do not use any products that contain nicotine or tobacco. These products include cigarettes, chewing tobacco, and vaping devices, such as e-cigarettes. If you need help quitting, ask your health care provider. Do not use drugs. If you are sexually active, practice safe sex. Use a condom or other form of protection to   prevent STIs. If you do not wish to become pregnant, use a form of birth control. If you plan to become pregnant, see your health care provider for a  prepregnancy visit. Take aspirin only as told by your health care provider. Make sure that you understand how much to take and what form to take. Work with your health care provider to find out whether it is safe and beneficial for you to take aspirin daily. Find healthy ways to manage stress, such as: Meditation, yoga, or listening to music. Journaling. Talking to a trusted person. Spending time with friends and family. Minimize exposure to UV radiation to reduce your risk of skin cancer. Safety Always wear your seat belt while driving or riding in a vehicle. Do not drive: If you have been drinking alcohol. Do not ride with someone who has been drinking. When you are tired or distracted. While texting. If you have been using any mind-altering substances or drugs. Wear a helmet and other protective equipment during sports activities. If you have firearms in your house, make sure you follow all gun safety procedures. Seek help if you have been physically or sexually abused. What's next? Visit your health care provider once a year for an annual wellness visit. Ask your health care provider how often you should have your eyes and teeth checked. Stay up to date on all vaccines. This information is not intended to replace advice given to you by your health care provider. Make sure you discuss any questions you have with your health care provider. Document Revised: 11/29/2020 Document Reviewed: 11/29/2020 Elsevier Patient Education  2024 [employer]  

## 2023-06-04 LAB — CYTOLOGY - PAP
Comment: NEGATIVE
Diagnosis: NEGATIVE
High risk HPV: NEGATIVE

## 2023-06-05 ENCOUNTER — Ambulatory Visit: Payer: Commercial Managed Care - PPO

## 2023-06-12 ENCOUNTER — Other Ambulatory Visit (HOSPITAL_COMMUNITY): Payer: Self-pay

## 2023-06-16 ENCOUNTER — Other Ambulatory Visit: Payer: Self-pay

## 2023-06-23 ENCOUNTER — Other Ambulatory Visit (HOSPITAL_COMMUNITY): Payer: Self-pay

## 2023-06-23 DIAGNOSIS — F419 Anxiety disorder, unspecified: Secondary | ICD-10-CM | POA: Diagnosis not present

## 2023-06-23 DIAGNOSIS — E039 Hypothyroidism, unspecified: Secondary | ICD-10-CM | POA: Diagnosis not present

## 2023-06-23 DIAGNOSIS — E785 Hyperlipidemia, unspecified: Secondary | ICD-10-CM | POA: Diagnosis not present

## 2023-06-23 MED ORDER — LEVOTHYROXINE SODIUM 75 MCG PO TABS
75.0000 ug | ORAL_TABLET | ORAL | 1 refills | Status: DC
  Filled 2023-06-26 – 2023-09-05 (×2): qty 90, 90d supply, fill #0
  Filled 2023-10-07 – 2023-12-01 (×2): qty 90, 90d supply, fill #1

## 2023-06-26 ENCOUNTER — Other Ambulatory Visit (HOSPITAL_COMMUNITY): Payer: Self-pay

## 2023-06-27 ENCOUNTER — Other Ambulatory Visit: Payer: Self-pay

## 2023-07-01 ENCOUNTER — Ambulatory Visit
Admission: RE | Admit: 2023-07-01 | Discharge: 2023-07-01 | Disposition: A | Discharge status: Home / Self Care | Payer: Commercial Managed Care - PPO | Source: Ambulatory Visit | Attending: Family Medicine | Admitting: Family Medicine

## 2023-07-01 DIAGNOSIS — Z1231 Encounter for screening mammogram for malignant neoplasm of breast: Secondary | ICD-10-CM

## 2023-07-16 ENCOUNTER — Other Ambulatory Visit (HOSPITAL_COMMUNITY): Payer: Self-pay

## 2023-07-24 ENCOUNTER — Other Ambulatory Visit: Payer: Self-pay

## 2023-07-28 ENCOUNTER — Other Ambulatory Visit (HOSPITAL_COMMUNITY): Payer: Self-pay

## 2023-08-20 ENCOUNTER — Other Ambulatory Visit: Payer: Self-pay | Admitting: Radiology

## 2023-08-20 ENCOUNTER — Other Ambulatory Visit (HOSPITAL_COMMUNITY): Payer: Self-pay

## 2023-08-20 ENCOUNTER — Other Ambulatory Visit: Payer: Self-pay

## 2023-08-20 DIAGNOSIS — N951 Menopausal and female climacteric states: Secondary | ICD-10-CM

## 2023-08-20 MED ORDER — ESTRADIOL 0.05 MG/24HR TD PTTW
1.0000 | MEDICATED_PATCH | TRANSDERMAL | 0 refills | Status: DC
  Filled 2023-08-20: qty 8, 28d supply, fill #0

## 2023-08-20 NOTE — Telephone Encounter (Signed)
 Med refill request: estradiol 0.05 mg patch Last AEX: 06/03/23 Next OV: 09/02/23 Last MMG (if hormonal med) 07/01/23 BI-RADS 1 negative Refill authorized: estradiol 0.05mg  patch #8, zero refills.  Sent to provider for review.

## 2023-08-29 ENCOUNTER — Other Ambulatory Visit (HOSPITAL_COMMUNITY): Payer: Self-pay

## 2023-08-29 MED ORDER — FLUTICASONE-SALMETEROL 250-50 MCG/ACT IN AEPB
1.0000 | INHALATION_SPRAY | Freq: Two times a day (BID) | RESPIRATORY_TRACT | 5 refills | Status: AC
Start: 2023-08-29 — End: ?
  Filled 2023-08-29: qty 60, 30d supply, fill #0
  Filled 2023-10-31: qty 60, 30d supply, fill #1
  Filled 2023-12-01: qty 60, 30d supply, fill #2
  Filled 2024-02-02: qty 60, 30d supply, fill #3
  Filled 2024-04-23: qty 60, 30d supply, fill #4

## 2023-09-01 ENCOUNTER — Other Ambulatory Visit (HOSPITAL_COMMUNITY): Payer: Self-pay

## 2023-09-02 ENCOUNTER — Ambulatory Visit: Payer: Commercial Managed Care - PPO | Admitting: Radiology

## 2023-09-02 ENCOUNTER — Other Ambulatory Visit (HOSPITAL_COMMUNITY): Payer: Self-pay

## 2023-09-02 DIAGNOSIS — N951 Menopausal and female climacteric states: Secondary | ICD-10-CM

## 2023-09-02 DIAGNOSIS — Z9889 Other specified postprocedural states: Secondary | ICD-10-CM | POA: Diagnosis not present

## 2023-09-02 DIAGNOSIS — J324 Chronic pansinusitis: Secondary | ICD-10-CM | POA: Diagnosis not present

## 2023-09-02 MED ORDER — ESTRADIOL 0.05 MG/24HR TD PTTW
1.0000 | MEDICATED_PATCH | TRANSDERMAL | 4 refills | Status: DC
  Filled 2023-09-02 – 2023-09-23 (×2): qty 8, 28d supply, fill #0
  Filled 2023-10-07 – 2023-10-17 (×2): qty 8, 28d supply, fill #1
  Filled 2023-11-12: qty 8, 28d supply, fill #2
  Filled 2023-12-11: qty 8, 28d supply, fill #3
  Filled 2024-01-08: qty 8, 28d supply, fill #4

## 2023-09-02 NOTE — Progress Notes (Signed)
   Sophia Perez August 21, 1970 [id number]   History:  53 y.o. G2P1 presents for follow up after starting HRT. At her annual exam she complained of severe irritability, hot flashes, skin changes, difficulty concentrating. Hot flashes without sweating. Joint pain, fatigue.Symptoms have all improved, feels back to her baseline.  Gynecologic History Hysterectomy  Sexually active: yes, no concerns  Health Maintenance Last Pap: 05/2023. Results were: normal, hx of colpo and CIN1 per pt Last mammogram: 07/01/23. Results were: normal Last colonoscopy: age 68   Past medical history, past surgical history, family history and social history were all reviewed and documented in the EPIC chart.  ROS:  A ROS was performed and pertinent positives and negatives are included.  Exam:  Vitals:   09/02/23 1446  BP: (!) 90/52  Weight: 151 lb 6.4 oz (68.7 kg)   Body mass index is 24.62 kg/m.  Physical Exam Vitals and nursing note reviewed.  Constitutional:      Appearance: Normal appearance.  Pulmonary:     Effort: Pulmonary effort is normal.  Neurological:     Mental Status: She is alert.  Psychiatric:        Mood and Affect: Mood normal.        Thought Content: Thought content normal.        Judgment: Judgment normal.      Assessment/Plan:   1. Menopausal symptoms - estradiol (VIVELLE-DOT) 0.05 MG/24HR patch; Place 1 patch (0.05 mg total) onto the skin 2 (two) times a week.  Dispense: 8 patch; Refill: 4   AEX due 05/2024  Arlie Solomons B WHNP-BC 2:55 PM 09/02/2023

## 2023-09-05 ENCOUNTER — Other Ambulatory Visit: Payer: Self-pay

## 2023-09-05 ENCOUNTER — Other Ambulatory Visit (HOSPITAL_COMMUNITY): Payer: Self-pay

## 2023-09-23 ENCOUNTER — Other Ambulatory Visit (HOSPITAL_COMMUNITY): Payer: Self-pay

## 2023-10-07 ENCOUNTER — Other Ambulatory Visit: Payer: Self-pay

## 2023-10-07 ENCOUNTER — Other Ambulatory Visit (HOSPITAL_COMMUNITY): Payer: Self-pay

## 2023-10-07 MED ORDER — ATORVASTATIN CALCIUM 10 MG PO TABS
10.0000 mg | ORAL_TABLET | Freq: Every day | ORAL | 2 refills | Status: AC
  Filled 2023-10-07: qty 90, 90d supply, fill #0

## 2023-10-27 ENCOUNTER — Other Ambulatory Visit (HOSPITAL_COMMUNITY): Payer: Self-pay

## 2023-10-28 ENCOUNTER — Other Ambulatory Visit (HOSPITAL_COMMUNITY): Payer: Self-pay

## 2023-10-28 MED ORDER — ALPRAZOLAM 0.25 MG PO TABS
0.2500 mg | ORAL_TABLET | ORAL | 2 refills | Status: AC | PRN
  Filled 2023-10-28: qty 10, 10d supply, fill #0
  Filled 2024-02-02: qty 10, 10d supply, fill #1
  Filled 2024-04-23: qty 10, 10d supply, fill #2

## 2023-10-29 ENCOUNTER — Other Ambulatory Visit (HOSPITAL_COMMUNITY): Payer: Self-pay

## 2023-10-29 ENCOUNTER — Encounter (HOSPITAL_COMMUNITY): Payer: Self-pay

## 2023-12-01 ENCOUNTER — Other Ambulatory Visit: Payer: Self-pay

## 2023-12-01 ENCOUNTER — Other Ambulatory Visit (HOSPITAL_COMMUNITY): Payer: Self-pay

## 2023-12-01 MED ORDER — BUDESONIDE 0.5 MG/2ML IN SUSP
2.0000 mL | RESPIRATORY_TRACT | 11 refills | Status: AC
  Filled 2023-12-01: qty 60, 30d supply, fill #0
  Filled 2024-02-02: qty 60, 30d supply, fill #1

## 2023-12-05 DIAGNOSIS — F419 Anxiety disorder, unspecified: Secondary | ICD-10-CM | POA: Diagnosis not present

## 2023-12-05 DIAGNOSIS — E785 Hyperlipidemia, unspecified: Secondary | ICD-10-CM | POA: Diagnosis not present

## 2023-12-05 DIAGNOSIS — Z23 Encounter for immunization: Secondary | ICD-10-CM | POA: Diagnosis not present

## 2023-12-05 DIAGNOSIS — Z0000 Encounter for general adult medical examination without abnormal findings: Secondary | ICD-10-CM | POA: Diagnosis not present

## 2023-12-05 DIAGNOSIS — E039 Hypothyroidism, unspecified: Secondary | ICD-10-CM | POA: Diagnosis not present

## 2023-12-08 ENCOUNTER — Other Ambulatory Visit (HOSPITAL_COMMUNITY): Payer: Self-pay

## 2023-12-08 MED ORDER — ATORVASTATIN CALCIUM 20 MG PO TABS
20.0000 mg | ORAL_TABLET | Freq: Every day | ORAL | 3 refills | Status: AC
  Filled 2023-12-08: qty 90, 90d supply, fill #0
  Filled 2024-03-12: qty 90, 90d supply, fill #1
  Filled 2024-06-15: qty 90, 90d supply, fill #0

## 2024-01-08 ENCOUNTER — Other Ambulatory Visit (HOSPITAL_COMMUNITY): Payer: Self-pay

## 2024-01-16 ENCOUNTER — Other Ambulatory Visit: Payer: Self-pay | Admitting: Radiology

## 2024-01-16 DIAGNOSIS — N951 Menopausal and female climacteric states: Secondary | ICD-10-CM

## 2024-01-16 MED ORDER — ESTRADIOL 0.075 MG/24HR TD PTTW: 1.0000 | MEDICATED_PATCH | TRANSDERMAL | 12 refills | Status: DC

## 2024-01-19 ENCOUNTER — Other Ambulatory Visit (HOSPITAL_COMMUNITY): Payer: Self-pay

## 2024-01-19 ENCOUNTER — Other Ambulatory Visit: Payer: Self-pay | Admitting: Radiology

## 2024-01-19 DIAGNOSIS — N951 Menopausal and female climacteric states: Secondary | ICD-10-CM

## 2024-01-19 MED ORDER — ESTRADIOL 0.075 MG/24HR TD PTTW
1.0000 | MEDICATED_PATCH | TRANSDERMAL | 6 refills | Status: DC
  Filled 2024-01-19: qty 8, 28d supply, fill #0
  Filled 2024-02-13: qty 8, 28d supply, fill #1
  Filled 2024-03-12: qty 8, 28d supply, fill #2
  Filled 2024-04-23: qty 8, 28d supply, fill #3
  Filled 2024-05-25: qty 8, 28d supply, fill #4

## 2024-02-02 ENCOUNTER — Other Ambulatory Visit (HOSPITAL_COMMUNITY): Payer: Self-pay

## 2024-02-03 ENCOUNTER — Other Ambulatory Visit (HOSPITAL_COMMUNITY): Payer: Self-pay

## 2024-02-03 ENCOUNTER — Other Ambulatory Visit: Payer: Self-pay

## 2024-02-03 MED ORDER — SERTRALINE HCL 100 MG PO TABS
150.0000 mg | ORAL_TABLET | Freq: Every day | ORAL | 2 refills | Status: AC
  Filled 2024-02-03: qty 135, 90d supply, fill #0
  Filled 2024-05-04: qty 135, 90d supply, fill #1
  Filled 2024-06-15: qty 135, 90d supply, fill #0

## 2024-02-03 MED ORDER — LEVOTHYROXINE SODIUM 75 MCG PO TABS
75.0000 ug | ORAL_TABLET | Freq: Every morning | ORAL | 2 refills | Status: AC
  Filled 2024-03-12: qty 90, 90d supply, fill #0
  Filled 2024-05-25: qty 60, 60d supply, fill #1

## 2024-02-05 ENCOUNTER — Other Ambulatory Visit (HOSPITAL_COMMUNITY): Payer: Self-pay

## 2024-02-05 DIAGNOSIS — H02889 Meibomian gland dysfunction of unspecified eye, unspecified eyelid: Secondary | ICD-10-CM | POA: Diagnosis not present

## 2024-02-05 DIAGNOSIS — H04129 Dry eye syndrome of unspecified lacrimal gland: Secondary | ICD-10-CM | POA: Diagnosis not present

## 2024-02-05 MED ORDER — FML FORTE 0.25 % OP SUSP
OPHTHALMIC | 0 refills | Status: AC
  Filled 2024-02-05: qty 5, 14d supply, fill #0

## 2024-02-06 ENCOUNTER — Other Ambulatory Visit (HOSPITAL_COMMUNITY): Payer: Self-pay

## 2024-02-06 MED ORDER — FLUOROMETHOLONE 0.1 % OP SUSP
OPHTHALMIC | 0 refills | Status: AC
  Filled 2024-02-06: qty 5, 14d supply, fill #0

## 2024-02-11 DIAGNOSIS — E785 Hyperlipidemia, unspecified: Secondary | ICD-10-CM | POA: Diagnosis not present

## 2024-02-17 ENCOUNTER — Other Ambulatory Visit (HOSPITAL_COMMUNITY): Payer: Self-pay

## 2024-03-12 ENCOUNTER — Other Ambulatory Visit: Payer: Self-pay

## 2024-03-12 ENCOUNTER — Other Ambulatory Visit (HOSPITAL_COMMUNITY): Payer: Self-pay

## 2024-03-12 DIAGNOSIS — H04123 Dry eye syndrome of bilateral lacrimal glands: Secondary | ICD-10-CM | POA: Diagnosis not present

## 2024-03-12 DIAGNOSIS — H5789 Other specified disorders of eye and adnexa: Secondary | ICD-10-CM | POA: Diagnosis not present

## 2024-04-23 ENCOUNTER — Other Ambulatory Visit (HOSPITAL_COMMUNITY): Payer: Self-pay

## 2024-04-23 ENCOUNTER — Other Ambulatory Visit: Payer: Self-pay

## 2024-04-27 ENCOUNTER — Other Ambulatory Visit: Payer: Self-pay

## 2024-05-04 ENCOUNTER — Other Ambulatory Visit (HOSPITAL_COMMUNITY): Payer: Self-pay

## 2024-05-04 ENCOUNTER — Other Ambulatory Visit: Payer: Self-pay

## 2024-05-04 MED ORDER — METHOCARBAMOL 500 MG PO TABS
500.0000 mg | ORAL_TABLET | Freq: Four times a day (QID) | ORAL | 2 refills | Status: DC
  Filled 2024-05-04: qty 45, 12d supply, fill #0
  Filled 2024-05-25: qty 45, 12d supply, fill #1

## 2024-05-04 MED ORDER — DICLOFENAC SODIUM 75 MG PO TBEC
75.0000 mg | DELAYED_RELEASE_TABLET | Freq: Two times a day (BID) | ORAL | 3 refills | Status: DC
  Filled 2024-05-04: qty 60, 30d supply, fill #0

## 2024-05-25 ENCOUNTER — Other Ambulatory Visit (HOSPITAL_COMMUNITY): Payer: Self-pay

## 2024-05-25 ENCOUNTER — Other Ambulatory Visit: Payer: Self-pay

## 2024-06-04 ENCOUNTER — Other Ambulatory Visit: Payer: Self-pay | Admitting: Family Medicine

## 2024-06-04 DIAGNOSIS — Z1231 Encounter for screening mammogram for malignant neoplasm of breast: Secondary | ICD-10-CM

## 2024-06-07 DIAGNOSIS — E039 Hypothyroidism, unspecified: Secondary | ICD-10-CM | POA: Diagnosis not present

## 2024-06-07 DIAGNOSIS — F419 Anxiety disorder, unspecified: Secondary | ICD-10-CM | POA: Diagnosis not present

## 2024-06-07 DIAGNOSIS — E785 Hyperlipidemia, unspecified: Secondary | ICD-10-CM | POA: Diagnosis not present

## 2024-06-15 ENCOUNTER — Encounter: Payer: Self-pay | Admitting: Radiology

## 2024-06-15 ENCOUNTER — Ambulatory Visit (INDEPENDENT_AMBULATORY_CARE_PROVIDER_SITE_OTHER): Admitting: Radiology

## 2024-06-15 ENCOUNTER — Other Ambulatory Visit (HOSPITAL_COMMUNITY): Payer: Self-pay

## 2024-06-15 VITALS — BP 98/66 | HR 68 | Ht 66.0 in | Wt 154.0 lb

## 2024-06-15 DIAGNOSIS — N8110 Cystocele, unspecified: Secondary | ICD-10-CM

## 2024-06-15 DIAGNOSIS — N951 Menopausal and female climacteric states: Secondary | ICD-10-CM | POA: Diagnosis not present

## 2024-06-15 DIAGNOSIS — N816 Rectocele: Secondary | ICD-10-CM

## 2024-06-15 DIAGNOSIS — Z01419 Encounter for gynecological examination (general) (routine) without abnormal findings: Secondary | ICD-10-CM | POA: Diagnosis not present

## 2024-06-15 DIAGNOSIS — Z1331 Encounter for screening for depression: Secondary | ICD-10-CM

## 2024-06-15 MED ORDER — DOTTI 0.1 MG/24HR TD PTTW
1.0000 | MEDICATED_PATCH | TRANSDERMAL | 4 refills | Status: AC
  Filled 2024-06-15: qty 24, 84d supply, fill #0

## 2024-06-15 NOTE — Patient Instructions (Signed)
 Preventive Care 52-52 Years Old, Female  Preventive care refers to lifestyle choices and visits with your health care provider that can promote health and wellness. Preventive care visits are also called wellness exams.  What can I expect for my preventive care visit?  Counseling  Your health care provider may ask you questions about your:  Medical history, including:  Past medical problems.  Family medical history.  Pregnancy history.  Current health, including:  Menstrual cycle.  Method of birth control.  Emotional well-being.  Home life and relationship well-being.  Sexual activity and sexual health.  Lifestyle, including:  Alcohol, nicotine or tobacco, and drug use.  Access to firearms.  Diet, exercise, and sleep habits.  Work and work Astronomer.  Sunscreen use.  Safety issues such as seatbelt and bike helmet use.  Physical exam  Your health care provider will check your:  Height and weight. These may be used to calculate your BMI (body mass index). BMI is a measurement that tells if you are at a healthy weight.  Waist circumference. This measures the distance around your waistline. This measurement also tells if you are at a healthy weight and may help predict your risk of certain diseases, such as type 2 diabetes and high blood pressure.  Heart rate and blood pressure.  Body temperature.  Skin for abnormal spots.  What immunizations do I need?    Vaccines are usually given at various ages, according to a schedule. Your health care provider will recommend vaccines for you based on your age, medical history, and lifestyle or other factors, such as travel or where you work.  What tests do I need?  Screening  Your health care provider may recommend screening tests for certain conditions. This may include:  Lipid and cholesterol levels.  Diabetes screening. This is done by checking your blood sugar (glucose) after you have not eaten for a while (fasting).  Pelvic exam and Pap test.  Hepatitis B test.  Hepatitis C  test.  HIV (human immunodeficiency virus) test.  STI (sexually transmitted infection) testing, if you are at risk.  Lung cancer screening.  Colorectal cancer screening.  Mammogram. Talk with your health care provider about when you should start having regular mammograms. This may depend on whether you have a family history of breast cancer.  BRCA-related cancer screening. This may be done if you have a family history of breast, ovarian, tubal, or peritoneal cancers.  Bone density scan. This is done to screen for osteoporosis.  Talk with your health care provider about your test results, treatment options, and if necessary, the need for more tests.  Follow these instructions at home:  Eating and drinking    Eat a diet that includes fresh fruits and vegetables, whole grains, lean protein, and low-fat dairy products.  Take vitamin and mineral supplements as recommended by your health care provider.  Do not drink alcohol if:  Your health care provider tells you not to drink.  You are pregnant, may be pregnant, or are planning to become pregnant.  If you drink alcohol:  Limit how much you have to 0-1 drink a day.  Know how much alcohol is in your drink. In the [place] one drink equals one 12 oz bottle of beer (355 mL), one 5 oz glass of wine (148 mL), or one 1 oz glass of hard liquor (44 mL).  Lifestyle  Brush your teeth every morning and night with fluoride toothpaste. Floss one time each day.  Exercise for at least  30 minutes 5 or more days each week.  Do not use any products that contain nicotine or tobacco. These products include cigarettes, chewing tobacco, and vaping devices, such as e-cigarettes. If you need help quitting, ask your health care provider.  Do not use drugs.  If you are sexually active, practice safe sex. Use a condom or other form of protection to prevent STIs.  If you do not wish to become pregnant, use a form of birth control. If you plan to become pregnant, see your health care provider for a  prepregnancy visit.  Take aspirin only as told by your health care provider. Make sure that you understand how much to take and what form to take. Work with your health care provider to find out whether it is safe and beneficial for you to take aspirin daily.  Find healthy ways to manage stress, such as:  Meditation, yoga, or listening to music.  Journaling.  Talking to a trusted person.  Spending time with friends and family.  Minimize exposure to UV radiation to reduce your risk of skin cancer.  Safety  Always wear your seat belt while driving or riding in a vehicle.  Do not drive:  If you have been drinking alcohol. Do not ride with someone who has been drinking.  When you are tired or distracted.  While texting.  If you have been using any mind-altering substances or drugs.  Wear a helmet and other protective equipment during sports activities.  If you have firearms in your house, make sure you follow all gun safety procedures.  Seek help if you have been physically or sexually abused.  What's next?  Visit your health care provider once a year for an annual wellness visit.  Ask your health care provider how often you should have your eyes and teeth checked.  Stay up to date on all vaccines.  This information is not intended to replace advice given to you by your health care provider. Make sure you discuss any questions you have with your health care provider.  Document Revised: 11/29/2020 Document Reviewed: 11/29/2020  Elsevier Patient Education  2024 [employer].

## 2024-06-15 NOTE — Progress Notes (Signed)
" ° °  Sophia Perez 1970-08-22 [id number]   History:  53 y.o. G2P1 presents for annual  Hysterectomy 11 years ago for prolapse Still has cystocele with rectocele, often has to splint to have a BM. Doing well on HRT, still feels hot often. Current patch causes rash.  Gynecologic History Hysterectomy  Sexually active: yes, no concerns  Health Maintenance Last Pap: 2014. Results were: normal, hx of colpo and CIN1 per pt Last mammogram: 1/25. Results were: normal, scheduled next week Last colonoscopy: age 15   Past medical history, past surgical history, family history and social history were all reviewed and documented in the EPIC chart.  ROS:  A ROS was performed and pertinent positives and negatives are included.  Exam:  Vitals:   06/15/24 1154  BP: 98/66  Pulse: 68  SpO2: 98%  Weight: 154 lb (69.9 kg)  Height: 5' 6 (1.676 m)   Body mass index is 24.86 kg/m.  General appearance:  Normal Thyroid :  Symmetrical, normal in size, without palpable masses or nodularity. Respiratory  Auscultation:  Clear without wheezing or rhonchi Cardiovascular  Auscultation:  Regular rate, without rubs, murmurs or gallops  Edema/varicosities:  Not grossly evident Abdominal  Soft,nontender, without masses, guarding or rebound.  Liver/spleen:  No organomegaly noted  Hernia:  None appreciated  Skin  Inspection:  Grossly normal Breasts: Examined lying and sitting.   Right: Without masses, retractions, nipple discharge or axillary adenopathy.   Left: Without masses, retractions, nipple discharge or axillary adenopathy. Genitourinary   Inguinal/mons:  Normal without inguinal adenopathy  External genitalia:  Normal appearing vulva with no masses, tenderness, or lesions  BUS/Urethra/Skene's glands:  Normal  Vagina:  Cystocele grade 3 with valsalva, grade 3 rectocele, otherwise normal appearing with normal color and discharge, no lesions. Atrophy mild  Cervix:  absent  Uterus:   absent  Adnexa/parametria:     Rt: Normal in size, without masses or tenderness.   Lt: Normal in size, without masses or tenderness.  Anus and perineum: Normal   Sophia Perez, CMA present for exam  Assessment/Plan:   1. Well woman exam with routine gynecological exam (Primary) Pap 2029 Mammo yearly Colonoscopy up to date  2. Cystocele with rectocele - Ambulatory referral to Urogynecology  3. Menopausal symptoms - DOTTI  0.1 MG/24HR patch; Place 1 patch (0.1 mg total) onto the skin 2 (two) times a week.  Dispense: 24 patch; Refill: 4  4. Depression screening   Follow up in 47yr Discussed SBE, colonoscopy and DEXA screening as appropriate. Encouraged 133mins/week of cardiovascular and weight bearing exercise minimum.     Sophia Perez B WHNP-BC 12:25 PM 06/15/2024  "

## 2024-06-16 ENCOUNTER — Other Ambulatory Visit (HOSPITAL_COMMUNITY): Payer: Self-pay

## 2024-06-16 ENCOUNTER — Other Ambulatory Visit: Payer: Self-pay

## 2024-07-01 ENCOUNTER — Ambulatory Visit

## 2024-07-02 ENCOUNTER — Ambulatory Visit
Admission: RE | Admit: 2024-07-02 | Discharge: 2024-07-02 | Disposition: A | Discharge status: Home / Self Care | Source: Ambulatory Visit | Attending: Family Medicine | Admitting: Family Medicine

## 2024-07-02 DIAGNOSIS — Z1231 Encounter for screening mammogram for malignant neoplasm of breast: Secondary | ICD-10-CM

## 2024-09-22 ENCOUNTER — Ambulatory Visit: Admitting: Obstetrics and Gynecology

## 2025-06-21 ENCOUNTER — Ambulatory Visit: Admitting: Radiology
